# Patient Record
Sex: Female | Born: 1980
Health system: Southern US, Community
[De-identification: ages and names within clinical notes are randomized; demographics above are authoritative.]

## PROBLEM LIST (undated history)

## (undated) DIAGNOSIS — Z973 Presence of spectacles and contact lenses: Secondary | ICD-10-CM

## (undated) DIAGNOSIS — J45909 Unspecified asthma, uncomplicated: Secondary | ICD-10-CM

## (undated) DIAGNOSIS — E282 Polycystic ovarian syndrome: Secondary | ICD-10-CM

## (undated) DIAGNOSIS — K8020 Calculus of gallbladder without cholecystitis without obstruction: Secondary | ICD-10-CM

## (undated) DIAGNOSIS — N7011 Chronic salpingitis: Secondary | ICD-10-CM

## (undated) DIAGNOSIS — Z8489 Family history of other specified conditions: Secondary | ICD-10-CM

## (undated) DIAGNOSIS — D649 Anemia, unspecified: Secondary | ICD-10-CM

## (undated) DIAGNOSIS — K219 Gastro-esophageal reflux disease without esophagitis: Secondary | ICD-10-CM

## (undated) HISTORY — PX: OVARIAN CYST REMOVAL: SHX89

## (undated) HISTORY — PX: DILATION AND CURETTAGE OF UTERUS: SHX78

---

## 2005-08-09 HISTORY — PX: EXPLORATORY LAPAROTOMY: SUR591

## 2019-01-19 DIAGNOSIS — L709 Acne, unspecified: Secondary | ICD-10-CM | POA: Diagnosis not present

## 2019-01-19 DIAGNOSIS — N946 Dysmenorrhea, unspecified: Secondary | ICD-10-CM | POA: Diagnosis not present

## 2019-01-19 DIAGNOSIS — Z23 Encounter for immunization: Secondary | ICD-10-CM | POA: Diagnosis not present

## 2019-01-19 DIAGNOSIS — N939 Abnormal uterine and vaginal bleeding, unspecified: Secondary | ICD-10-CM | POA: Diagnosis not present

## 2019-01-19 DIAGNOSIS — Z13220 Encounter for screening for lipoid disorders: Secondary | ICD-10-CM | POA: Diagnosis not present

## 2019-02-20 DIAGNOSIS — N939 Abnormal uterine and vaginal bleeding, unspecified: Secondary | ICD-10-CM | POA: Diagnosis not present

## 2019-03-01 DIAGNOSIS — J45909 Unspecified asthma, uncomplicated: Secondary | ICD-10-CM | POA: Diagnosis not present

## 2019-03-01 DIAGNOSIS — R10813 Right lower quadrant abdominal tenderness: Secondary | ICD-10-CM | POA: Diagnosis not present

## 2019-03-01 DIAGNOSIS — N858 Other specified noninflammatory disorders of uterus: Secondary | ICD-10-CM | POA: Diagnosis not present

## 2019-03-01 DIAGNOSIS — R109 Unspecified abdominal pain: Secondary | ICD-10-CM | POA: Diagnosis not present

## 2019-03-01 DIAGNOSIS — K8020 Calculus of gallbladder without cholecystitis without obstruction: Secondary | ICD-10-CM | POA: Diagnosis not present

## 2019-03-02 DIAGNOSIS — Z124 Encounter for screening for malignant neoplasm of cervix: Secondary | ICD-10-CM | POA: Diagnosis not present

## 2019-03-02 DIAGNOSIS — N921 Excessive and frequent menstruation with irregular cycle: Secondary | ICD-10-CM | POA: Diagnosis not present

## 2019-03-02 DIAGNOSIS — R87610 Atypical squamous cells of undetermined significance on cytologic smear of cervix (ASC-US): Secondary | ICD-10-CM | POA: Diagnosis not present

## 2019-03-16 DIAGNOSIS — L709 Acne, unspecified: Secondary | ICD-10-CM | POA: Diagnosis not present

## 2019-03-16 DIAGNOSIS — R748 Abnormal levels of other serum enzymes: Secondary | ICD-10-CM | POA: Diagnosis not present

## 2019-03-16 DIAGNOSIS — Z5181 Encounter for therapeutic drug level monitoring: Secondary | ICD-10-CM | POA: Diagnosis not present

## 2019-03-20 DIAGNOSIS — A549 Gonococcal infection, unspecified: Secondary | ICD-10-CM | POA: Diagnosis not present

## 2019-03-20 DIAGNOSIS — D259 Leiomyoma of uterus, unspecified: Secondary | ICD-10-CM | POA: Diagnosis not present

## 2019-03-20 DIAGNOSIS — Z793 Long term (current) use of hormonal contraceptives: Secondary | ICD-10-CM | POA: Diagnosis not present

## 2019-03-20 DIAGNOSIS — J45909 Unspecified asthma, uncomplicated: Secondary | ICD-10-CM | POA: Diagnosis not present

## 2019-03-20 DIAGNOSIS — R102 Pelvic and perineal pain: Secondary | ICD-10-CM | POA: Diagnosis not present

## 2019-03-20 DIAGNOSIS — D252 Subserosal leiomyoma of uterus: Secondary | ICD-10-CM | POA: Diagnosis not present

## 2019-03-20 DIAGNOSIS — Z79899 Other long term (current) drug therapy: Secondary | ICD-10-CM | POA: Diagnosis not present

## 2019-04-06 DIAGNOSIS — A549 Gonococcal infection, unspecified: Secondary | ICD-10-CM | POA: Diagnosis not present

## 2019-04-06 DIAGNOSIS — Z202 Contact with and (suspected) exposure to infections with a predominantly sexual mode of transmission: Secondary | ICD-10-CM | POA: Diagnosis not present

## 2019-04-06 DIAGNOSIS — Z3169 Encounter for other general counseling and advice on procreation: Secondary | ICD-10-CM | POA: Diagnosis not present

## 2019-04-06 DIAGNOSIS — D252 Subserosal leiomyoma of uterus: Secondary | ICD-10-CM | POA: Diagnosis not present

## 2019-05-02 DIAGNOSIS — R0683 Snoring: Secondary | ICD-10-CM | POA: Diagnosis not present

## 2019-05-02 DIAGNOSIS — J309 Allergic rhinitis, unspecified: Secondary | ICD-10-CM | POA: Diagnosis not present

## 2019-07-24 DIAGNOSIS — N911 Secondary amenorrhea: Secondary | ICD-10-CM | POA: Diagnosis not present

## 2019-08-16 DIAGNOSIS — Z1239 Encounter for other screening for malignant neoplasm of breast: Secondary | ICD-10-CM | POA: Diagnosis not present

## 2019-08-16 DIAGNOSIS — N6321 Unspecified lump in the left breast, upper outer quadrant: Secondary | ICD-10-CM | POA: Diagnosis not present

## 2019-08-16 DIAGNOSIS — N6324 Unspecified lump in the left breast, lower inner quadrant: Secondary | ICD-10-CM | POA: Diagnosis not present

## 2019-08-16 DIAGNOSIS — N6489 Other specified disorders of breast: Secondary | ICD-10-CM | POA: Diagnosis not present

## 2019-08-24 DIAGNOSIS — N939 Abnormal uterine and vaginal bleeding, unspecified: Secondary | ICD-10-CM | POA: Diagnosis not present

## 2019-09-25 DIAGNOSIS — E282 Polycystic ovarian syndrome: Secondary | ICD-10-CM | POA: Diagnosis not present

## 2019-09-25 DIAGNOSIS — R7303 Prediabetes: Secondary | ICD-10-CM | POA: Diagnosis not present

## 2019-09-25 DIAGNOSIS — R0683 Snoring: Secondary | ICD-10-CM | POA: Diagnosis not present

## 2019-09-25 DIAGNOSIS — L709 Acne, unspecified: Secondary | ICD-10-CM | POA: Diagnosis not present

## 2019-09-28 DIAGNOSIS — N6324 Unspecified lump in the left breast, lower inner quadrant: Secondary | ICD-10-CM | POA: Diagnosis not present

## 2019-12-12 DIAGNOSIS — R102 Pelvic and perineal pain: Secondary | ICD-10-CM | POA: Diagnosis not present

## 2019-12-12 DIAGNOSIS — B9689 Other specified bacterial agents as the cause of diseases classified elsewhere: Secondary | ICD-10-CM | POA: Diagnosis not present

## 2019-12-12 DIAGNOSIS — N760 Acute vaginitis: Secondary | ICD-10-CM | POA: Diagnosis not present

## 2020-01-05 ENCOUNTER — Emergency Department (HOSPITAL_COMMUNITY)
Admission: EM | Admit: 2020-01-05 | Discharge: 2020-01-05 | Discharge status: Against Medical Advice | Payer: 59 | Attending: Emergency Medicine | Admitting: Emergency Medicine

## 2020-01-05 ENCOUNTER — Other Ambulatory Visit: Payer: Self-pay

## 2020-01-05 ENCOUNTER — Encounter (HOSPITAL_COMMUNITY): Payer: Self-pay | Admitting: Emergency Medicine

## 2020-01-05 DIAGNOSIS — R1030 Lower abdominal pain, unspecified: Secondary | ICD-10-CM | POA: Diagnosis not present

## 2020-01-05 DIAGNOSIS — R109 Unspecified abdominal pain: Secondary | ICD-10-CM | POA: Diagnosis not present

## 2020-01-05 DIAGNOSIS — N898 Other specified noninflammatory disorders of vagina: Secondary | ICD-10-CM | POA: Diagnosis not present

## 2020-01-05 DIAGNOSIS — Z5321 Procedure and treatment not carried out due to patient leaving prior to being seen by health care provider: Secondary | ICD-10-CM | POA: Insufficient documentation

## 2020-01-05 DIAGNOSIS — Z7984 Long term (current) use of oral hypoglycemic drugs: Secondary | ICD-10-CM | POA: Diagnosis not present

## 2020-01-05 DIAGNOSIS — E282 Polycystic ovarian syndrome: Secondary | ICD-10-CM | POA: Diagnosis not present

## 2020-01-05 DIAGNOSIS — Z87891 Personal history of nicotine dependence: Secondary | ICD-10-CM | POA: Diagnosis not present

## 2020-01-05 HISTORY — DX: Polycystic ovarian syndrome: E28.2

## 2020-01-05 LAB — COMPREHENSIVE METABOLIC PANEL
ALT: 38 U/L (ref 0–44)
AST: 26 U/L (ref 15–41)
Albumin: 3.7 g/dL (ref 3.5–5.0)
Alkaline Phosphatase: 102 U/L (ref 38–126)
Anion gap: 11 (ref 5–15)
BUN: 13 mg/dL (ref 6–20)
CO2: 25 mmol/L (ref 22–32)
Calcium: 9.3 mg/dL (ref 8.9–10.3)
Chloride: 105 mmol/L (ref 98–111)
Creatinine, Ser: 0.7 mg/dL (ref 0.44–1.00)
GFR calc Af Amer: >60 mL/min (ref >60)
GFR calc non Af Amer: >60 mL/min (ref >60)
Glucose, Bld: 102 mg/dL — ABNORMAL HIGH (ref 70–99)
Potassium: 3.5 mmol/L (ref 3.5–5.1)
Sodium: 141 mmol/L (ref 135–145)
Total Bilirubin: 0.4 mg/dL (ref 0.3–1.2)
Total Protein: 7.5 g/dL (ref 6.5–8.1)

## 2020-01-05 LAB — CBC
HCT: 41.8 % (ref 36.0–46.0)
Hemoglobin: 13.1 g/dL (ref 12.0–15.0)
MCH: 26.3 pg (ref 26.0–34.0)
MCHC: 31.3 g/dL (ref 30.0–36.0)
MCV: 83.8 fL (ref 80.0–100.0)
Platelets: 279 10*3/uL (ref 150–400)
RBC: 4.99 MIL/uL (ref 3.87–5.11)
RDW: 13.7 % (ref 11.5–15.5)
WBC: 5.5 10*3/uL (ref 4.0–10.5)
nRBC: 0 % (ref 0.0–0.2)

## 2020-01-05 LAB — I-STAT BETA HCG BLOOD, ED (MC, WL, AP ONLY): I-stat hCG, quantitative: <5 m[IU]/mL (ref <5)

## 2020-01-05 LAB — LIPASE, BLOOD: Lipase: 35 U/L (ref 11–51)

## 2020-01-05 MED ORDER — SODIUM CHLORIDE 0.9% FLUSH: 3.0000 mL | Freq: Once | INTRAVENOUS | Status: DC

## 2020-01-05 NOTE — ED Triage Notes (Signed)
C/o pain that is around waist (abd and back) x 1 week.  Pain is worse when she bends over.  Also reports vaginal irritation.  Denies nausea and vomiting.  States she normally has diarrhea due to metformin- no change in stools.  States she had normal wet prep at GYN on 5/5.

## 2020-01-05 NOTE — ED Notes (Signed)
Called pt x5 for room, check outside no response.

## 2020-01-05 NOTE — ED Notes (Signed)
Called pt x2 for room, no response. 

## 2020-04-18 DIAGNOSIS — N926 Irregular menstruation, unspecified: Secondary | ICD-10-CM | POA: Diagnosis not present

## 2020-04-18 DIAGNOSIS — E282 Polycystic ovarian syndrome: Secondary | ICD-10-CM | POA: Diagnosis not present

## 2020-04-18 DIAGNOSIS — Z6841 Body Mass Index (BMI) 40.0 and over, adult: Secondary | ICD-10-CM | POA: Diagnosis not present

## 2020-04-18 DIAGNOSIS — E65 Localized adiposity: Secondary | ICD-10-CM | POA: Diagnosis not present

## 2020-04-18 DIAGNOSIS — R7303 Prediabetes: Secondary | ICD-10-CM | POA: Diagnosis not present

## 2020-04-18 DIAGNOSIS — E669 Obesity, unspecified: Secondary | ICD-10-CM | POA: Diagnosis not present

## 2020-08-03 DIAGNOSIS — S0990XA Unspecified injury of head, initial encounter: Secondary | ICD-10-CM | POA: Diagnosis not present

## 2020-08-03 DIAGNOSIS — S0512XA Contusion of eyeball and orbital tissues, left eye, initial encounter: Secondary | ICD-10-CM | POA: Diagnosis not present

## 2020-08-03 DIAGNOSIS — S0993XA Unspecified injury of face, initial encounter: Secondary | ICD-10-CM | POA: Diagnosis not present

## 2020-08-03 DIAGNOSIS — G8911 Acute pain due to trauma: Secondary | ICD-10-CM | POA: Diagnosis not present

## 2020-08-11 DIAGNOSIS — S0012XA Contusion of left eyelid and periocular area, initial encounter: Secondary | ICD-10-CM | POA: Diagnosis not present

## 2020-08-11 DIAGNOSIS — H1132 Conjunctival hemorrhage, left eye: Secondary | ICD-10-CM | POA: Diagnosis not present

## 2020-09-23 DIAGNOSIS — S0012XA Contusion of left eyelid and periocular area, initial encounter: Secondary | ICD-10-CM | POA: Diagnosis not present

## 2020-09-23 DIAGNOSIS — S0012XD Contusion of left eyelid and periocular area, subsequent encounter: Secondary | ICD-10-CM | POA: Diagnosis not present

## 2020-10-06 DIAGNOSIS — J069 Acute upper respiratory infection, unspecified: Secondary | ICD-10-CM | POA: Diagnosis not present

## 2020-10-07 DIAGNOSIS — Z20822 Contact with and (suspected) exposure to covid-19: Secondary | ICD-10-CM | POA: Diagnosis not present

## 2020-10-17 DIAGNOSIS — N3000 Acute cystitis without hematuria: Secondary | ICD-10-CM | POA: Diagnosis not present

## 2020-10-17 DIAGNOSIS — E282 Polycystic ovarian syndrome: Secondary | ICD-10-CM | POA: Diagnosis not present

## 2020-10-21 ENCOUNTER — Encounter (HOSPITAL_BASED_OUTPATIENT_CLINIC_OR_DEPARTMENT_OTHER): Payer: Self-pay | Admitting: Emergency Medicine

## 2020-10-21 ENCOUNTER — Emergency Department (HOSPITAL_BASED_OUTPATIENT_CLINIC_OR_DEPARTMENT_OTHER)
Admission: EM | Admit: 2020-10-21 | Discharge: 2020-10-21 | Disposition: A | Discharge status: Home / Self Care | Payer: BC Managed Care – PPO | Attending: Emergency Medicine | Admitting: Emergency Medicine

## 2020-10-21 ENCOUNTER — Other Ambulatory Visit: Payer: Self-pay

## 2020-10-21 ENCOUNTER — Emergency Department (HOSPITAL_BASED_OUTPATIENT_CLINIC_OR_DEPARTMENT_OTHER): Payer: BC Managed Care – PPO

## 2020-10-21 ENCOUNTER — Other Ambulatory Visit: Payer: Self-pay | Admitting: Family Medicine

## 2020-10-21 DIAGNOSIS — N898 Other specified noninflammatory disorders of vagina: Secondary | ICD-10-CM | POA: Insufficient documentation

## 2020-10-21 DIAGNOSIS — R102 Pelvic and perineal pain: Secondary | ICD-10-CM | POA: Diagnosis not present

## 2020-10-21 DIAGNOSIS — D259 Leiomyoma of uterus, unspecified: Secondary | ICD-10-CM

## 2020-10-21 DIAGNOSIS — N760 Acute vaginitis: Secondary | ICD-10-CM

## 2020-10-21 DIAGNOSIS — R109 Unspecified abdominal pain: Secondary | ICD-10-CM

## 2020-10-21 DIAGNOSIS — R112 Nausea with vomiting, unspecified: Secondary | ICD-10-CM | POA: Diagnosis not present

## 2020-10-21 DIAGNOSIS — B9689 Other specified bacterial agents as the cause of diseases classified elsewhere: Secondary | ICD-10-CM

## 2020-10-21 DIAGNOSIS — K8020 Calculus of gallbladder without cholecystitis without obstruction: Secondary | ICD-10-CM | POA: Diagnosis not present

## 2020-10-21 DIAGNOSIS — R630 Anorexia: Secondary | ICD-10-CM | POA: Insufficient documentation

## 2020-10-21 DIAGNOSIS — R1032 Left lower quadrant pain: Secondary | ICD-10-CM | POA: Diagnosis not present

## 2020-10-21 DIAGNOSIS — E282 Polycystic ovarian syndrome: Secondary | ICD-10-CM

## 2020-10-21 LAB — COMPREHENSIVE METABOLIC PANEL
ALT: 55 U/L — ABNORMAL HIGH (ref 0–44)
AST: 36 U/L (ref 15–41)
Albumin: 4.1 g/dL (ref 3.5–5.0)
Alkaline Phosphatase: 134 U/L — ABNORMAL HIGH (ref 38–126)
Anion gap: 10 (ref 5–15)
BUN: 11 mg/dL (ref 6–20)
CO2: 26 mmol/L (ref 22–32)
Calcium: 9.1 mg/dL (ref 8.9–10.3)
Chloride: 103 mmol/L (ref 98–111)
Creatinine, Ser: 0.8 mg/dL (ref 0.44–1.00)
GFR, Estimated: >60 mL/min (ref >60)
Glucose, Bld: 138 mg/dL — ABNORMAL HIGH (ref 70–99)
Potassium: 3.5 mmol/L (ref 3.5–5.1)
Sodium: 139 mmol/L (ref 135–145)
Total Bilirubin: 0.3 mg/dL (ref 0.3–1.2)
Total Protein: 7.2 g/dL (ref 6.5–8.1)

## 2020-10-21 LAB — URINALYSIS, ROUTINE W REFLEX MICROSCOPIC
Bilirubin Urine: NEGATIVE
Glucose, UA: NEGATIVE mg/dL
Hgb urine dipstick: NEGATIVE
Ketones, ur: NEGATIVE mg/dL
Nitrite: NEGATIVE
Specific Gravity, Urine: 1.031 — ABNORMAL HIGH (ref 1.005–1.030)
pH: 5.5 (ref 5.0–8.0)

## 2020-10-21 LAB — CBC WITH DIFFERENTIAL/PLATELET
Abs Immature Granulocytes: 0.02 10*3/uL (ref 0.00–0.07)
Basophils Absolute: 0 10*3/uL (ref 0.0–0.1)
Basophils Relative: 1 %
Eosinophils Absolute: 0.2 10*3/uL (ref 0.0–0.5)
Eosinophils Relative: 3 %
HCT: 40.2 % (ref 36.0–46.0)
Hemoglobin: 12.6 g/dL (ref 12.0–15.0)
Immature Granulocytes: 0 %
Lymphocytes Relative: 41 %
Lymphs Abs: 2.7 10*3/uL (ref 0.7–4.0)
MCH: 26.8 pg (ref 26.0–34.0)
MCHC: 31.3 g/dL (ref 30.0–36.0)
MCV: 85.4 fL (ref 80.0–100.0)
Monocytes Absolute: 0.3 10*3/uL (ref 0.1–1.0)
Monocytes Relative: 4 %
Neutro Abs: 3.4 10*3/uL (ref 1.7–7.7)
Neutrophils Relative %: 51 %
Platelets: 269 10*3/uL (ref 150–400)
RBC: 4.71 MIL/uL (ref 3.87–5.11)
RDW: 13.6 % (ref 11.5–15.5)
WBC: 6.6 10*3/uL (ref 4.0–10.5)
nRBC: 0 % (ref 0.0–0.2)

## 2020-10-21 LAB — WET PREP, GENITAL
Sperm: NONE SEEN
Trich, Wet Prep: NONE SEEN
Yeast Wet Prep HPF POC: NONE SEEN

## 2020-10-21 LAB — PREGNANCY, URINE: Preg Test, Ur: NEGATIVE

## 2020-10-21 MED ORDER — KETOROLAC TROMETHAMINE 15 MG/ML IJ SOLN
15.0000 mg | Freq: Once | INTRAMUSCULAR | Status: AC
  Administered 2020-10-21: 15 mg via INTRAVENOUS
  Filled 2020-10-21: qty 1

## 2020-10-21 MED ORDER — LACTATED RINGERS IV BOLUS
1000.0000 mL | Freq: Once | INTRAVENOUS | Status: AC
  Administered 2020-10-21: 1000 mL via INTRAVENOUS

## 2020-10-21 MED ORDER — ONDANSETRON HCL 4 MG/2ML IJ SOLN
4.0000 mg | Freq: Once | INTRAMUSCULAR | Status: AC
  Administered 2020-10-21: 4 mg via INTRAVENOUS
  Filled 2020-10-21: qty 2

## 2020-10-21 MED ORDER — METRONIDAZOLE 500 MG PO TABS: 500.0000 mg | ORAL_TABLET | Freq: Two times a day (BID) | ORAL | 0 refills | Status: DC

## 2020-10-21 MED ORDER — IOHEXOL 300 MG/ML  SOLN
100.0000 mL | Freq: Once | INTRAMUSCULAR | Status: AC | PRN
  Administered 2020-10-21: 100 mL via INTRAVENOUS

## 2020-10-21 NOTE — ED Triage Notes (Signed)
Pt via pov from home with LLQ abdominal pain x 6 days. Pt states she has PCOS. She began having vomiting on Wednesday, was in severe pain until yesterday, has eased off some today. Pt states she has been in communication with her physician, and had an appointment with her today. The physican sent her here for imaging, since they could not get an outpatient appointment today. Pt reports vomiting x 2 today, denies diarrhea, still having some nausea. NAD noted.

## 2020-10-21 NOTE — ED Notes (Signed)
Pt to US.

## 2020-10-21 NOTE — Discharge Instructions (Addendum)
It was our pleasure to provide your ER care today - we hope that you feel better.  Take acetaminophen or ibuprofen as need for symptom relief.   On your imaging, no definite cause of your left-sided pain was noted.  Incidental note was made of small uterine fibroids and a gallstone in your gallbladder - see attached information.   Your lab tests show some 'clue cells' - take antibiotic as prescribed.   If recurrent pain related to gallstones - follow up with general surgeon - see attached info, call to arrange appointment.   Return to ER if worse, new symptoms, fevers, severe pain, persistent vomiting, or other concern.

## 2020-10-21 NOTE — ED Provider Notes (Signed)
[facility] EMERGENC[facility] DEPT Provider Note   CSN: [id number] Arrival date & time: 10/21/20  1145     History Chief Complaint  Patient presents with  . Abdominal Pain    Amy Henry is a 40 y.o. female.  The history is provided by the patient.  Abdominal Pain Pain location:  LLQ Pain quality: cramping, gnawing, sharp and shooting   Pain radiation: slightly down the front of the left leg and to the left side but not back to the flank. Pain severity now: currently 4/10 but at it's max 8/10. Onset quality:  Gradual Duration:  1 week Timing:  Constant Progression:  Waxing and waning Chronicity:  Recurrent Context: previous surgery   Context: not recent illness, not recent travel and not sick contacts   Context comment:  Hx of ovarian cyst with surgery in the past but feels similar Relieved by:  Nothing Worsened by:  Palpation Ineffective treatments:  None tried Associated symptoms: anorexia, nausea and vomiting   Associated symptoms: no diarrhea, no dysuria, no fever, no hematuria, no vaginal bleeding and no vaginal discharge   Risk factors comment:  Pcos      Past Medical History:  Diagnosis Date  . PCOS (polycystic ovarian syndrome)     There are no problems to display for this patient.   Past Surgical History:  Procedure Laterality Date  . OVARIAN CYST REMOVAL       OB History   No obstetric history on file.     History reviewed. No pertinent family history.  Social History   Tobacco Use  . Smoking status: Never Smoker  . Smokeless tobacco: Never Used  Vaping Use  . Vaping Use: Never used  Substance Use Topics  . Alcohol use: Yes    Comment: "a couple of times per week"  . Drug use: Not Currently    Home Medications Prior to Admission medications   Not on File    Allergies    Patient has no known allergies.  Review of Systems   Review of Systems  Constitutional: Negative for fever.  Gastrointestinal: Positive for  abdominal pain, anorexia, nausea and vomiting. Negative for diarrhea.  Genitourinary: Negative for dysuria, hematuria, vaginal bleeding and vaginal discharge.  All other systems reviewed and are negative.   Physical Exam Updated Vital Signs BP (!) 146/83 (BP Location: Right Arm)   Pulse 76   Temp 97.9 F (36.6 C) (Oral)   Resp 13   Ht 5' 4.5" (1.638 m)   Wt 115.7 kg   SpO2 100%   BMI 43.09 kg/m   Physical Exam Vitals and nursing note reviewed.  Constitutional:      General: She is not in acute distress.    Appearance: Normal appearance. She is well-developed.  HENT:     Head: Normocephalic and atraumatic.     Mouth/Throat:     Mouth: Mucous membranes are moist.  Eyes:     Pupils: Pupils are equal, round, and reactive to light.  Cardiovascular:     Rate and Rhythm: Normal rate and regular rhythm.     Pulses: Normal pulses.     Heart sounds: Normal heart sounds. No murmur heard. No friction rub.  Pulmonary:     Effort: Pulmonary effort is normal.     Breath sounds: Normal breath sounds. No wheezing or rales.  Abdominal:     General: Bowel sounds are normal. There is no distension.     Palpations: Abdomen is soft.     Tenderness: There  is abdominal tenderness in the left lower quadrant. There is guarding. There is no left CVA tenderness or rebound.  Genitourinary:    Vagina: Vaginal discharge present.     Cervix: Normal.     Uterus: Normal.      Adnexa: Right adnexa normal.       Left: Tenderness present. No mass or fullness.       Comments: Thin white d/c present Musculoskeletal:        General: No tenderness. Normal range of motion.     Cervical back: Normal range of motion.     Comments: No edema  Skin:    General: Skin is warm and dry.     Findings: No rash.  Neurological:     General: No focal deficit present.     Mental Status: She is alert and oriented to person, place, and time. Mental status is at baseline.     Cranial Nerves: No cranial nerve deficit.   Psychiatric:        Mood and Affect: Mood normal.        Behavior: Behavior normal.        Thought Content: Thought content normal.     ED Results / Procedures / Treatments   Labs (all labs ordered are listed, but only abnormal results are displayed) Labs Reviewed  WET PREP, GENITAL - Abnormal; Notable for the following components:      Result Value   Clue Cells Wet Prep HPF POC PRESENT (*)    WBC, Wet Prep HPF POC MODERATE (*)    All other components within normal limits  COMPREHENSIVE METABOLIC PANEL - Abnormal; Notable for the following components:   Glucose, Bld 138 (*)    ALT 55 (*)    Alkaline Phosphatase 134 (*)    All other components within normal limits  URINALYSIS, ROUTINE W REFLEX MICROSCOPIC - Abnormal; Notable for the following components:   Specific Gravity, Urine 1.031 (*)    Protein, ur TRACE (*)    Leukocytes,Ua TRACE (*)    All other components within normal limits  CBC WITH DIFFERENTIAL/PLATELET  PREGNANCY, URINE  GC/CHLAMYDIA PROBE AMP ([place]) NOT AT [facility]    EKG None  Radiology US PELVIC COMPLETE W TRANSVAGINAL AND TORSION R/O  Result Date: 10/21/2020 CLINICAL DATA:  Post left lower quadrant pain EXAM: TRANSABDOMINAL AND TRANSVAGINAL ULTRASOUND OF PELVIS DOPPLER ULTRASOUND OF OVARIES TECHNIQUE: Both transabdominal and transvaginal ultrasound examinations of the pelvis were performed. Transabdominal technique was performed for global imaging of the pelvis including uterus, ovaries, adnexal regions, and pelvic cul-de-sac. It was necessary to proceed with endovaginal exam following the transabdominal exam to visualize the left ovary and adnexa. Color and duplex Doppler ultrasound was utilized to evaluate blood flow to the ovaries. COMPARISON:  None. FINDINGS: Uterus Measurements: 8.6 x 5.8 x 5.8 cm = volume: 151.3 mL. 1.7 x 1.8 x 1 cm subserosal fibroid at the fundus. 1.6 x 2 x 1.1 cm intramural fibroid at the ventral body. Endometrium Thickness: 8 mm.   No focal abnormality visualized. Right ovary Measurements: 2.3 x 1 x 1.5 cm = volume: 3.3 mL. Normal appearance/no adnexal mass. Left ovary Measurements: 2.2 x 2.4 x 2.2 cm = volume: 3.9 mL. Normal appearance/no adnexal mass. Pulsed Doppler evaluation of both ovaries demonstrates normal low-resistance arterial and venous waveforms. Other findings No abnormal free fluid. IMPRESSION: No evidence of ovarian torsion. Two small uterine fibroids. Electronically Signed   By: Macy Mis M.D.   On: 10/21/2020 14:02  Procedures Procedures   Medications Ordered in ED Medications  ondansetron (ZOFRAN) injection 4 mg (has no administration in time range)  lactated ringers bolus 1,000 mL (has no administration in time range)    ED Course  I have reviewed the triage vital signs and the nursing notes.  Pertinent labs & imaging results that were available during my care of the patient were reviewed by me and considered in my medical decision making (see chart for details).    MDM Rules/Calculators/A&P                          40 year old female with a history of PCOS and prior ovarian cyst presenting today with a 1 week history of left lower quadrant pain.  She saw her doctor today who sent her here because she felt that she needed imaging.  Patient reports that the pain is slightly improved today but she has been having intermittent vomiting, decreased oral intake and left lower quadrant pain.  No diarrhea or fever.  On exam patient has very localized pain in the left lower quadrant concerning for most likely ovarian pathology but could also be diverticulitis.  Patient reports she took a home pregnancy and urine test last week which was negative for UTI or pregnancy.  She does use contraception and is currently sexually active with one partner for the last 1 year but reports they both get tested on a fairly regular basis and have been negative for STIs.  She denies any vaginal discharge or itching or  burning.  On exam vital signs are within normal limits.  We will do pelvic exam for further evaluation.  Labs and urine are pending.  Patient will most likely need transvaginal ultrasound.  She currently just wanted medication for nausea but reports the pain is tolerable at a 4 out of 10.  12:33 PM  Left pelvic pain present.  U/S to further evaluate is pending.  2:19 PM Wet prep with clue cells and moderate white blood cells but no evidence of trichomonas or yeast.  Ultrasound without acute findings or cause for her pain.  On reevaluation patient is still having significant left lower quadrant and pelvic pain.  We will do a CT to rule out diverticulitis.  Final Clinical Impression(s) / ED Diagnoses Final diagnoses:  None    Rx / DC Orders ED Discharge Orders    None       Blanchie Dessert, MD 10/21/20 1420

## 2020-10-21 NOTE — ED Provider Notes (Addendum)
Signed out to d/c to home when ct back (and that will need rx clue cells present on wet prep, but clinically no PID).  CT neg for acute process.   Discussed incidental note of small fibroids, and gallstone.   Recheck abd soft non tender.   Pt appears stable for d/c.       Lajean Saver, MD 10/21/20 1620

## 2020-10-21 NOTE — ED Notes (Signed)
Pt partner brought back to room  Pt has call bell and TV remote - lights turned down  ED process explained

## 2020-10-21 NOTE — ED Notes (Signed)
Patient returned from CT

## 2020-10-21 NOTE — ED Notes (Signed)
Pt returned from ultrasound

## 2020-10-22 LAB — GC/CHLAMYDIA PROBE AMP (~~LOC~~) NOT AT ARMC
Chlamydia: NEGATIVE
Comment: NEGATIVE
Comment: NORMAL
Neisseria Gonorrhea: NEGATIVE

## 2020-10-28 ENCOUNTER — Other Ambulatory Visit: Payer: 59

## 2020-10-29 DIAGNOSIS — Z309 Encounter for contraceptive management, unspecified: Secondary | ICD-10-CM | POA: Diagnosis not present

## 2020-10-29 DIAGNOSIS — N946 Dysmenorrhea, unspecified: Secondary | ICD-10-CM | POA: Diagnosis not present

## 2020-10-29 DIAGNOSIS — B9689 Other specified bacterial agents as the cause of diseases classified elsewhere: Secondary | ICD-10-CM | POA: Diagnosis not present

## 2020-10-29 DIAGNOSIS — N760 Acute vaginitis: Secondary | ICD-10-CM | POA: Diagnosis not present

## 2021-02-24 DIAGNOSIS — Z8639 Personal history of other endocrine, nutritional and metabolic disease: Secondary | ICD-10-CM | POA: Diagnosis not present

## 2021-02-24 DIAGNOSIS — E282 Polycystic ovarian syndrome: Secondary | ICD-10-CM | POA: Diagnosis not present

## 2021-02-24 DIAGNOSIS — I10 Essential (primary) hypertension: Secondary | ICD-10-CM | POA: Diagnosis not present

## 2021-03-06 DIAGNOSIS — I10 Essential (primary) hypertension: Secondary | ICD-10-CM | POA: Diagnosis not present

## 2021-03-06 DIAGNOSIS — Z8639 Personal history of other endocrine, nutritional and metabolic disease: Secondary | ICD-10-CM | POA: Diagnosis not present

## 2021-03-06 DIAGNOSIS — E282 Polycystic ovarian syndrome: Secondary | ICD-10-CM | POA: Diagnosis not present

## 2021-03-12 DIAGNOSIS — E785 Hyperlipidemia, unspecified: Secondary | ICD-10-CM | POA: Diagnosis not present

## 2021-03-12 DIAGNOSIS — R748 Abnormal levels of other serum enzymes: Secondary | ICD-10-CM | POA: Diagnosis not present

## 2021-03-12 DIAGNOSIS — I10 Essential (primary) hypertension: Secondary | ICD-10-CM | POA: Diagnosis not present

## 2021-03-12 DIAGNOSIS — R7989 Other specified abnormal findings of blood chemistry: Secondary | ICD-10-CM | POA: Diagnosis not present

## 2021-04-23 DIAGNOSIS — R7303 Prediabetes: Secondary | ICD-10-CM | POA: Diagnosis not present

## 2021-04-23 DIAGNOSIS — K8020 Calculus of gallbladder without cholecystitis without obstruction: Secondary | ICD-10-CM | POA: Diagnosis not present

## 2021-04-23 DIAGNOSIS — I10 Essential (primary) hypertension: Secondary | ICD-10-CM | POA: Diagnosis not present

## 2021-05-05 DIAGNOSIS — K828 Other specified diseases of gallbladder: Secondary | ICD-10-CM | POA: Diagnosis not present

## 2021-05-05 DIAGNOSIS — K8020 Calculus of gallbladder without cholecystitis without obstruction: Secondary | ICD-10-CM | POA: Diagnosis not present

## 2021-06-02 DIAGNOSIS — J019 Acute sinusitis, unspecified: Secondary | ICD-10-CM | POA: Diagnosis not present

## 2021-06-03 DIAGNOSIS — Z20822 Contact with and (suspected) exposure to covid-19: Secondary | ICD-10-CM | POA: Diagnosis not present

## 2021-07-01 ENCOUNTER — Emergency Department (HOSPITAL_BASED_OUTPATIENT_CLINIC_OR_DEPARTMENT_OTHER): Payer: Medicaid Other

## 2021-07-01 ENCOUNTER — Emergency Department (HOSPITAL_BASED_OUTPATIENT_CLINIC_OR_DEPARTMENT_OTHER): Payer: Medicaid Other | Admitting: Radiology

## 2021-07-01 ENCOUNTER — Encounter (HOSPITAL_BASED_OUTPATIENT_CLINIC_OR_DEPARTMENT_OTHER): Payer: Self-pay | Admitting: Emergency Medicine

## 2021-07-01 ENCOUNTER — Other Ambulatory Visit: Payer: Self-pay

## 2021-07-01 ENCOUNTER — Emergency Department (HOSPITAL_BASED_OUTPATIENT_CLINIC_OR_DEPARTMENT_OTHER)
Admission: EM | Admit: 2021-07-01 | Discharge: 2021-07-01 | Disposition: A | Discharge status: Home / Self Care | Payer: Medicaid Other | Attending: Emergency Medicine | Admitting: Emergency Medicine

## 2021-07-01 DIAGNOSIS — R7401 Elevation of levels of liver transaminase levels: Secondary | ICD-10-CM

## 2021-07-01 DIAGNOSIS — Z7982 Long term (current) use of aspirin: Secondary | ICD-10-CM | POA: Diagnosis not present

## 2021-07-01 DIAGNOSIS — R1013 Epigastric pain: Secondary | ICD-10-CM

## 2021-07-01 DIAGNOSIS — K920 Hematemesis: Secondary | ICD-10-CM | POA: Diagnosis not present

## 2021-07-01 DIAGNOSIS — K7689 Other specified diseases of liver: Secondary | ICD-10-CM | POA: Diagnosis not present

## 2021-07-01 DIAGNOSIS — K8020 Calculus of gallbladder without cholecystitis without obstruction: Secondary | ICD-10-CM | POA: Diagnosis not present

## 2021-07-01 LAB — CBC WITH DIFFERENTIAL/PLATELET
Abs Immature Granulocytes: 0.03 10*3/uL (ref 0.00–0.07)
Basophils Absolute: 0.1 10*3/uL (ref 0.0–0.1)
Basophils Relative: 1 %
Eosinophils Absolute: 0.2 10*3/uL (ref 0.0–0.5)
Eosinophils Relative: 3 %
HCT: 39.3 % (ref 36.0–46.0)
Hemoglobin: 12.5 g/dL (ref 12.0–15.0)
Immature Granulocytes: 1 %
Lymphocytes Relative: 33 %
Lymphs Abs: 1.8 10*3/uL (ref 0.7–4.0)
MCH: 27.2 pg (ref 26.0–34.0)
MCHC: 31.8 g/dL (ref 30.0–36.0)
MCV: 85.4 fL (ref 80.0–100.0)
Monocytes Absolute: 0.3 10*3/uL (ref 0.1–1.0)
Monocytes Relative: 6 %
Neutro Abs: 3.1 10*3/uL (ref 1.7–7.7)
Neutrophils Relative %: 56 %
Platelets: 294 10*3/uL (ref 150–400)
RBC: 4.6 MIL/uL (ref 3.87–5.11)
RDW: 13.4 % (ref 11.5–15.5)
WBC: 5.4 10*3/uL (ref 4.0–10.5)
nRBC: 0 % (ref 0.0–0.2)

## 2021-07-01 LAB — COMPREHENSIVE METABOLIC PANEL
ALT: 123 U/L — ABNORMAL HIGH (ref 0–44)
AST: 156 U/L — ABNORMAL HIGH (ref 15–41)
Albumin: 4.3 g/dL (ref 3.5–5.0)
Alkaline Phosphatase: 186 U/L — ABNORMAL HIGH (ref 38–126)
Anion gap: 8 (ref 5–15)
BUN: 13 mg/dL (ref 6–20)
CO2: 28 mmol/L (ref 22–32)
Calcium: 10.1 mg/dL (ref 8.9–10.3)
Chloride: 100 mmol/L (ref 98–111)
Creatinine, Ser: 0.8 mg/dL (ref 0.44–1.00)
GFR, Estimated: >60 mL/min (ref >60)
Glucose, Bld: 110 mg/dL — ABNORMAL HIGH (ref 70–99)
Potassium: 4.2 mmol/L (ref 3.5–5.1)
Sodium: 136 mmol/L (ref 135–145)
Total Bilirubin: 1 mg/dL (ref 0.3–1.2)
Total Protein: 7.7 g/dL (ref 6.5–8.1)

## 2021-07-01 LAB — LIPASE, BLOOD: Lipase: 21 U/L (ref 11–51)

## 2021-07-01 MED ORDER — OMEPRAZOLE 20 MG PO CPDR: 20.0000 mg | DELAYED_RELEASE_CAPSULE | Freq: Every day | ORAL | 0 refills | Status: DC

## 2021-07-01 MED ORDER — SUCRALFATE 1 G PO TABS: 1.0000 g | ORAL_TABLET | Freq: Three times a day (TID) | ORAL | 0 refills | Status: DC

## 2021-07-01 MED ORDER — LIDOCAINE VISCOUS HCL 2 % MT SOLN
15.0000 mL | Freq: Once | OROMUCOSAL | Status: AC
  Administered 2021-07-01: 15 mL via ORAL
  Filled 2021-07-01: qty 15

## 2021-07-01 MED ORDER — ONDANSETRON 4 MG PO TBDP
4.0000 mg | ORAL_TABLET | Freq: Once | ORAL | Status: AC
  Administered 2021-07-01: 4 mg via ORAL
  Filled 2021-07-01: qty 1

## 2021-07-01 MED ORDER — FAMOTIDINE 20 MG PO TABS: 20.0000 mg | ORAL_TABLET | Freq: Two times a day (BID) | ORAL | 0 refills | Status: AC

## 2021-07-01 MED ORDER — ALUM & MAG HYDROXIDE-SIMETH 200-200-20 MG/5ML PO SUSP
30.0000 mL | Freq: Once | ORAL | Status: AC
  Administered 2021-07-01: 30 mL via ORAL
  Filled 2021-07-01: qty 30

## 2021-07-01 NOTE — ED Triage Notes (Signed)
States abd pain about 11 and she vomited  x3  , it had some blood in it last time

## 2021-07-01 NOTE — ED Provider Notes (Signed)
[facility] EMERGENCY DEPT Provider Note   CSN: [id number] Arrival date & time: 07/01/21  1513     History Chief Complaint  Patient presents with   Abdominal Pain    Amy Henry is a 40 y.o. female.  Patient with history of PCOS, no previous abdominal surgeries presents to the emergency department for evaluation of vomiting and upper abdominal pain.  Patient has a known history of gallstones.  She states that around 11 AM she developed pain in her epigastric area.  Around 1 PM she developed vomiting.  She had 3 episodes and the last episode she noticed some bright red blood in the vomit.  No chest pain or shortness of breath.  No fevers.  She had a bowel movement but no diarrhea or blood in the stool.  No history of peptic ulcer disease.  She denies heavy NSAID use but has taken aspirin several times over the past couple of days while at work.  No alcohol use.  She states that pain is gradually improving but not gone.  The onset of this condition was acute. The course is constant. Aggravating factors: none. Alleviating factors: none.        Past Medical History:  Diagnosis Date   PCOS (polycystic ovarian syndrome)     There are no problems to display for this patient.   Past Surgical History:  Procedure Laterality Date   OVARIAN CYST REMOVAL       OB History   No obstetric history on file.     No family history on file.  Social History   Tobacco Use   Smoking status: Never   Smokeless tobacco: Never  Vaping Use   Vaping Use: Never used  Substance Use Topics   Alcohol use: Yes    Comment: "a couple of times per week"   Drug use: Not Currently    Home Medications Prior to Admission medications   Medication Sig Start Date End Date Taking? Authorizing Provider  ipratropium (ATROVENT) 0.03 % nasal spray Place into the nose. 10/06/20   [provider]  ipratropium (ATROVENT) 0.03 % nasal spray Place into both nostrils. 10/06/20   [provider]  metFORMIN (GLUCOPHAGE) 1000 MG tablet Take by mouth.    [provider]  metroNIDAZOLE (FLAGYL) 500 MG tablet Take 1 tablet (500 mg total) by mouth 2 (two) times daily. 10/21/20   Lajean Saver, MD  Norgestimate-Ethinyl Estradiol Triphasic (TRI-LO-MARZIA) 0.18/0.215/0.25 MG-25 MCG tab Take 1 tablet by mouth daily. 10/17/20   [provider]  spironolactone (ALDACTONE) 50 MG tablet Take 1 tablet by mouth daily. 04/18/20 04/18/21  [provider]    Allergies    Patient has no known allergies.  Review of Systems   Review of Systems  Constitutional:  Negative for fever.  HENT:  Negative for rhinorrhea and sore throat.   Eyes:  Negative for redness.  Respiratory:  Negative for cough.   Cardiovascular:  Negative for chest pain.  Gastrointestinal:  Positive for abdominal pain, nausea and vomiting. Negative for blood in stool and diarrhea.  Genitourinary:  Negative for dysuria, frequency, hematuria and urgency.  Musculoskeletal:  Negative for myalgias.  Skin:  Negative for rash.  Neurological:  Negative for headaches.   Physical Exam Updated Vital Signs BP (!) 145/81 (BP Location: Right Wrist)   Pulse 73   Temp 98.3 F (36.8 C)   Resp 18   Ht 5\' 4"  (1.626 m)   Wt 106.6 kg   SpO2 100%  BMI 40.34 kg/m   Physical Exam Vitals and nursing note reviewed.  Constitutional:      General: She is not in acute distress.    Appearance: She is well-developed.  HENT:     Head: Normocephalic and atraumatic.     Right Ear: External ear normal.     Left Ear: External ear normal.     Nose: Nose normal.  Eyes:     Conjunctiva/sclera: Conjunctivae normal.  Cardiovascular:     Rate and Rhythm: Normal rate and regular rhythm.     Heart sounds: No murmur heard. Pulmonary:     Effort: No respiratory distress.     Breath sounds: No wheezing, rhonchi or rales.  Abdominal:     Palpations: Abdomen is soft.     Tenderness: There is abdominal tenderness in  the epigastric area and left upper quadrant. There is no guarding or rebound.     Comments: Mild epigastric and left upper quadrant pain.  No focal pain over the right upper quadrant.  No rebound or guarding.  Musculoskeletal:     Cervical back: Normal range of motion and neck supple.     Right lower leg: No edema.     Left lower leg: No edema.  Skin:    General: Skin is warm and dry.     Findings: No rash.  Neurological:     General: No focal deficit present.     Mental Status: She is alert. Mental status is at baseline.     Motor: No weakness.  Psychiatric:        Mood and Affect: Mood normal.    ED Results / Procedures / Treatments   Labs (all labs ordered are listed, but only abnormal results are displayed) Labs Reviewed  COMPREHENSIVE METABOLIC PANEL - Abnormal; Notable for the following components:      Result Value   Glucose, Bld 110 (*)    AST 156 (*)    ALT 123 (*)    Alkaline Phosphatase 186 (*)    All other components within normal limits  CBC WITH DIFFERENTIAL/PLATELET  LIPASE, BLOOD  PREGNANCY, URINE    EKG None  Radiology DG Chest 2 View  Result Date: 07/01/2021 CLINICAL DATA:  Hematemesis EXAM: CHEST - 2 VIEW COMPARISON:  None. FINDINGS: The heart size and mediastinal contours are within normal limits. Both lungs are clear. The visualized skeletal structures are unremarkable. IMPRESSION: Negative. Electronically Signed   By: Rolm Baptise M.D.   On: 07/01/2021 16:37   US Abdomen Limited RUQ (LIVER/GB)  Result Date: 07/01/2021 CLINICAL DATA:  Epigastric pain transaminitis EXAM: ULTRASOUND ABDOMEN LIMITED RIGHT UPPER QUADRANT COMPARISON:  CT 10/21/2020 FINDINGS: Gallbladder: Shadowing stone within the gallbladder measuring up to 1.2 cm. Normal wall thickness. Negative sonographic Murphy. Common bile duct: Diameter: 4.9 mm Liver: Diffusely echogenic. No focal hepatic abnormality. Portal vein is patent on color Doppler imaging with normal direction of blood flow  towards the liver. Other: None. IMPRESSION: 1. Cholelithiasis without sonographic evidence for acute cholecystitis 2. Echogenic liver consistent with hepatic steatosis Electronically Signed   By: Donavan Foil M.D.   On: 07/01/2021 18:31    Procedures Procedures   Medications Ordered in ED Medications - No data to display  ED Course  I have reviewed the triage vital signs and the nursing notes.  Pertinent labs & imaging results that were available during my care of the patient were reviewed by me and considered in my medical decision making (see chart for details).  Patient seen and examined.   Labs: CBC, CMP, lipase due to gallbladder history.  Urine pregnancy due to vomiting.  Imaging: Chest x-ray due to reported hematemesis.  Medications: ODT Zofran, GI cocktail.  Patient did drive here today.  Vital signs reviewed and are as follows: BP (!) 145/81 (BP Location: Right Wrist)   Pulse 73   Temp 98.3 F (36.8 C)   Resp 18   Ht 5\' 4"  (1.626 m)   Wt 106.6 kg   SpO2 100%   BMI 40.34 kg/m   AST, ALT, alkaline phosphatase were high.  For this reason right upper quadrant ultrasound was ordered.  6:54 PM patient updated on all results.  She states that the GI cocktail given earlier did help.  She has mild pain currently and no rebound or guarding on reexam.  Plan for discharge to home with PPI, H2 blocker, Carafate.  Encouraged patient to reschedule appointment with surgeon for evaluation of her gallbladder.  Encourage PCP follow-up for recheck of symptoms and to have her liver function test rechecked.  Discussed avoidance of excessive Tylenol, NSAID, alcohol use as these can stress the liver and stomach.  Patient states he is comfortable with discharged home.  The patient was urged to return to the Emergency Department immediately with worsening of current symptoms, worsening abdominal pain, persistent vomiting, blood noted in stools, fever, or any other concerns. The patient  verbalized understanding.     MDM Rules/Calculators/A&P                           Patient with abdominal pain. Vitals are stable, no fever. Labs elevated liver function tests. Imaging RUQ ultrasound with gallstones, no cholecystitis. No signs of dehydration, patient is now tolerating PO's. Lungs are clear and no signs suggestive of PNA. Low concern for appendicitis, cholecystitis, pancreatitis, ruptured viscus, UTI, kidney stone, aortic dissection, aortic aneurysm or other emergent abdominal etiology. Supportive therapy indicated with return if symptoms worsen.   Hematemesis: With multiple episodes of vomiting.  Suspect Mallory-Weiss tears.  Normal hemoglobin.  Chest x-ray is clear.  Elevated LFTs: Ultrasound with hepatic steatosis. Will need rechecked by her doctor.  She has had isolated singular LFT elevation in the past.  Values today are worse than previous.  She will need to have these rechecked by her doctor with additional work-up if persistently elevated.  Cholelithiasis: Unclear if this is contributing to symptoms today, however patient's pain is now controlled.  She did get relief with GI cocktail which may indicate more of a gastritis/PUD picture.   Final Clinical Impression(s) / ED Diagnoses Final diagnoses:  Epigastric pain  Transaminitis  Hematemesis with nausea  Calculus of gallbladder without cholecystitis without obstruction    Rx / DC Orders ED Discharge Orders          Ordered    famotidine (PEPCID) 20 MG tablet  2 times daily        07/01/21 1852    sucralfate (CARAFATE) 1 g tablet  3 times daily with meals & bedtime        07/01/21 1852    omeprazole (PRILOSEC) 20 MG capsule  Daily        07/01/21 1852             Carlisle Cater, Hershal Coria 07/01/21 1858    Luna Fuse, MD 07/01/21 2310

## 2021-07-01 NOTE — Discharge Instructions (Signed)
Please read and follow all provided instructions.  Your diagnoses today include:  1. Epigastric pain   2. Transaminitis   3. Hematemesis with nausea   4. Calculus of gallbladder without cholecystitis without obstruction     Tests performed today include: Blood cell counts and platelets: Normal white and red blood cell count Kidney and liver function tests: Your liver function tests were mildly elevated, you will need to have this rechecked by your doctor Pancreas function test (called lipase): Was normal Ultrasound of the gallbladder: She has a gallstone without signs of gallbladder infection Vital signs. See below for your results today.   Medications prescribed:  Pepcid (famotidine) - antihistamine  You can find this medication over-the-counter.   DO NOT exceed:  20mg  Pepcid every 12 hours  Omeprazole (Prilosec) - stomach acid reducer  This medication can be found over-the-counter  Carafate - for stomach upset and to protect your stomach  Take any prescribed medications only as directed.  Home care instructions:  Follow any educational materials contained in this packet. Please avoid excessive amounts of Tylenol, NSAID medication, alcohol  Follow-up instructions: Please follow-up with your primary care provider in the next 7 days for further evaluation of your symptoms.    Return instructions:  SEEK IMMEDIATE MEDICAL ATTENTION IF: The pain does not go away or becomes severe  A temperature above 101F develops  Repeated vomiting occurs (multiple episodes)  The pain becomes localized to portions of the abdomen. The right side could possibly be appendicitis. In an adult, the left lower portion of the abdomen could be colitis or diverticulitis.  Blood is being passed in stools or vomit (bright red or black tarry stools)  You develop chest pain, difficulty breathing, dizziness or fainting, or become confused, poorly responsive, or inconsolable (young children) If you have  any other emergent concerns regarding your health  Additional Information: Abdominal (belly) pain can be caused by many things. Your caregiver performed an examination and possibly ordered blood/urine tests and imaging (CT scan, x-rays, ultrasound). Many cases can be observed and treated at home after initial evaluation in the emergency department. Even though you are being discharged home, abdominal pain can be unpredictable. Therefore, you need a repeated exam if your pain does not resolve, returns, or worsens. Most patients with abdominal pain don't have to be admitted to the hospital or have surgery, but serious problems like appendicitis and gallbladder attacks can start out as nonspecific pain. Many abdominal conditions cannot be diagnosed in one visit, so follow-up evaluations are very important.  Your vital signs today were: BP 123/79   Pulse 77   Temp 98.3 F (36.8 C)   Resp 16   Ht 5\' 4"  (1.626 m)   Wt 106.6 kg   SpO2 100%   BMI 40.34 kg/m  If your blood pressure (bp) was elevated above 135/85 this visit, please have this repeated by your doctor within one month. --------------

## 2021-07-03 ENCOUNTER — Telehealth: Payer: Self-pay

## 2021-07-03 NOTE — Telephone Encounter (Signed)
Transition Care Management Unsuccessful Follow-up Telephone Call  Date of discharge and from where:  07/01/2021-[facility]  Attempts:  1st Attempt  Reason for unsuccessful TCM follow-up call:  Unable to leave message

## 2021-07-06 NOTE — Telephone Encounter (Signed)
Transition Care Management Unsuccessful Follow-up Telephone Call  Date of discharge and from where:  07/01/2021-[facility]  Attempts:  2nd Attempt  Reason for unsuccessful TCM follow-up call:  Unable to leave message

## 2021-07-07 NOTE — Telephone Encounter (Signed)
Transition Care Management Unsuccessful Follow-up Telephone Call  Date of discharge and from where:  07/01/2021 from [facility]  Attempts:  3rd Attempt  Reason for unsuccessful TCM follow-up call:  Unable to reach patient

## 2021-08-15 ENCOUNTER — Encounter (HOSPITAL_BASED_OUTPATIENT_CLINIC_OR_DEPARTMENT_OTHER): Payer: Self-pay | Admitting: *Deleted

## 2021-08-15 ENCOUNTER — Other Ambulatory Visit: Payer: Self-pay

## 2021-08-15 ENCOUNTER — Emergency Department (HOSPITAL_BASED_OUTPATIENT_CLINIC_OR_DEPARTMENT_OTHER): Payer: Medicaid Other | Admitting: Radiology

## 2021-08-15 ENCOUNTER — Emergency Department (HOSPITAL_BASED_OUTPATIENT_CLINIC_OR_DEPARTMENT_OTHER)
Admission: EM | Admit: 2021-08-15 | Discharge: 2021-08-15 | Disposition: A | Discharge status: Home / Self Care | Payer: Medicaid Other | Attending: Emergency Medicine | Admitting: Emergency Medicine

## 2021-08-15 DIAGNOSIS — G43919 Migraine, unspecified, intractable, without status migrainosus: Secondary | ICD-10-CM | POA: Insufficient documentation

## 2021-08-15 DIAGNOSIS — I10 Essential (primary) hypertension: Secondary | ICD-10-CM | POA: Insufficient documentation

## 2021-08-15 DIAGNOSIS — R519 Headache, unspecified: Secondary | ICD-10-CM | POA: Diagnosis present

## 2021-08-15 DIAGNOSIS — R0789 Other chest pain: Secondary | ICD-10-CM | POA: Diagnosis not present

## 2021-08-15 LAB — PREGNANCY, URINE: Preg Test, Ur: NEGATIVE

## 2021-08-15 LAB — COMPREHENSIVE METABOLIC PANEL
ALT: 49 U/L — ABNORMAL HIGH (ref 0–44)
AST: 32 U/L (ref 15–41)
Albumin: 3.9 g/dL (ref 3.5–5.0)
Alkaline Phosphatase: 125 U/L (ref 38–126)
Anion gap: 8 (ref 5–15)
BUN: 12 mg/dL (ref 6–20)
CO2: 28 mmol/L (ref 22–32)
Calcium: 9 mg/dL (ref 8.9–10.3)
Chloride: 102 mmol/L (ref 98–111)
Creatinine, Ser: 0.71 mg/dL (ref 0.44–1.00)
GFR, Estimated: >60 mL/min (ref >60)
Glucose, Bld: 123 mg/dL — ABNORMAL HIGH (ref 70–99)
Potassium: 3.7 mmol/L (ref 3.5–5.1)
Sodium: 138 mmol/L (ref 135–145)
Total Bilirubin: 0.3 mg/dL (ref 0.3–1.2)
Total Protein: 7.2 g/dL (ref 6.5–8.1)

## 2021-08-15 LAB — CBC WITH DIFFERENTIAL/PLATELET
Abs Immature Granulocytes: 0.03 10*3/uL (ref 0.00–0.07)
Basophils Absolute: 0 10*3/uL (ref 0.0–0.1)
Basophils Relative: 1 %
Eosinophils Absolute: 0.2 10*3/uL (ref 0.0–0.5)
Eosinophils Relative: 4 %
HCT: 39.4 % (ref 36.0–46.0)
Hemoglobin: 12.5 g/dL (ref 12.0–15.0)
Immature Granulocytes: 1 %
Lymphocytes Relative: 48 %
Lymphs Abs: 2.5 10*3/uL (ref 0.7–4.0)
MCH: 26.9 pg (ref 26.0–34.0)
MCHC: 31.7 g/dL (ref 30.0–36.0)
MCV: 84.7 fL (ref 80.0–100.0)
Monocytes Absolute: 0.3 10*3/uL (ref 0.1–1.0)
Monocytes Relative: 5 %
Neutro Abs: 2.2 10*3/uL (ref 1.7–7.7)
Neutrophils Relative %: 41 %
Platelets: 279 10*3/uL (ref 150–400)
RBC: 4.65 MIL/uL (ref 3.87–5.11)
RDW: 13.1 % (ref 11.5–15.5)
WBC: 5.2 10*3/uL (ref 4.0–10.5)
nRBC: 0 % (ref 0.0–0.2)

## 2021-08-15 LAB — TROPONIN I (HIGH SENSITIVITY)
Troponin I (High Sensitivity): <2 ng/L (ref <18)
Troponin I (High Sensitivity): <2 ng/L (ref <18)

## 2021-08-15 MED ORDER — SODIUM CHLORIDE 0.9 % IV BOLUS
500.0000 mL | Freq: Once | INTRAVENOUS | Status: AC
Start: 2021-08-15 — End: 2021-08-15
  Administered 2021-08-15: 500 mL via INTRAVENOUS

## 2021-08-15 MED ORDER — PROCHLORPERAZINE EDISYLATE 10 MG/2ML IJ SOLN
10.0000 mg | Freq: Once | INTRAMUSCULAR | Status: AC
  Administered 2021-08-15: 10 mg via INTRAVENOUS
  Filled 2021-08-15: qty 2

## 2021-08-15 MED ORDER — DIPHENHYDRAMINE HCL 50 MG/ML IJ SOLN
12.5000 mg | Freq: Once | INTRAMUSCULAR | Status: AC
Start: 2021-08-15 — End: 2021-08-15
  Administered 2021-08-15: 12.5 mg via INTRAVENOUS
  Filled 2021-08-15: qty 1

## 2021-08-15 MED ORDER — KETOROLAC TROMETHAMINE 15 MG/ML IJ SOLN
15.0000 mg | Freq: Once | INTRAMUSCULAR | Status: AC
  Administered 2021-08-15: 15 mg via INTRAVENOUS
  Filled 2021-08-15: qty 1

## 2021-08-15 NOTE — ED Provider Notes (Signed)
[facility] EMERGENCY DEPT Provider Note   CSN: [id number] Arrival date & time: 08/15/21  1236     History  Chief Complaint  Patient presents with   Hypertension    Amy Henry is a 41 y.o. female with history of PCOS who presents emergency department with concern of high blood pressure, and headache.  Patient recently stopped spironolactone due to insurance, she was on this previously for acne associated with PCOS.  She states since stopping this medication she has had a headache with some intermittent chest discomfort.  She believed it was related to her blood pressure being high.  She tried herbal remedies at home with no relief.  She states she has had headaches in the past, and this feels different than prior.   Hypertension Associated symptoms include headaches. Pertinent negatives include no chest pain, no abdominal pain and no shortness of breath.     Past Medical History:  Diagnosis Date   PCOS (polycystic ovarian syndrome)      Home Medications Prior to Admission medications   Medication Sig Start Date End Date Taking? Authorizing Provider  famotidine (PEPCID) 20 MG tablet Take 1 tablet (20 mg total) by mouth 2 (two) times daily. 07/01/21   Carlisle Cater, PA-C  ipratropium (ATROVENT) 0.03 % nasal spray Place into the nose. 10/06/20   [provider]  ipratropium (ATROVENT) 0.03 % nasal spray Place into both nostrils. 10/06/20   [provider]  metFORMIN (GLUCOPHAGE) 1000 MG tablet Take by mouth.    [provider]  metroNIDAZOLE (FLAGYL) 500 MG tablet Take 1 tablet (500 mg total) by mouth 2 (two) times daily. 10/21/20   Lajean Saver, MD  Norgestimate-Ethinyl Estradiol Triphasic (TRI-LO-MARZIA) 0.18/0.215/0.25 MG-25 MCG tab Take 1 tablet by mouth daily. 10/17/20   [provider]  omeprazole (PRILOSEC) 20 MG capsule Take 1 capsule (20 mg total) by mouth daily. 07/01/21   Carlisle Cater, PA-C  spironolactone (ALDACTONE)  50 MG tablet Take 1 tablet by mouth daily. 04/18/20 04/18/21  [provider]  sucralfate (CARAFATE) 1 g tablet Take 1 tablet (1 g total) by mouth 4 (four) times daily -  with meals and at bedtime. 07/01/21   Carlisle Cater, PA-C      Allergies    Patient has no known allergies.    Review of Systems   Review of Systems  Constitutional:  Negative for chills and fever.  HENT:  Negative for congestion.   Eyes:  Positive for photophobia.  Respiratory:  Negative for cough and shortness of breath.   Cardiovascular:  Negative for chest pain.  Gastrointestinal:  Negative for abdominal pain, constipation, diarrhea, nausea and vomiting.  Genitourinary:  Negative for dysuria and flank pain.  Musculoskeletal:  Negative for myalgias.  Neurological:  Positive for headaches. Negative for dizziness, tremors, seizures, syncope, facial asymmetry, speech difficulty, weakness, light-headedness and numbness.  All other systems reviewed and are negative.  Physical Exam Updated Vital Signs BP (!) 102/51 (BP Location: Right Arm)    Pulse 84    Temp 98.4 F (36.9 C) (Oral)    Resp 20    Ht 5\' 4"  (1.626 m)    Wt 108.9 kg    LMP 08/04/2021 (Approximate)    SpO2 100%    BMI 41.20 kg/m  Physical Exam Vitals and nursing note reviewed.  Constitutional:      Appearance: Normal appearance.  HENT:     Head: Normocephalic and atraumatic.  Eyes:     Conjunctiva/sclera: Conjunctivae normal.  Cardiovascular:  Rate and Rhythm: Normal rate and regular rhythm.  Pulmonary:     Effort: Pulmonary effort is normal. No respiratory distress.     Breath sounds: Normal breath sounds.  Abdominal:     General: There is no distension.     Palpations: Abdomen is soft.     Tenderness: There is no abdominal tenderness.  Musculoskeletal:     Right lower leg: No edema.     Left lower leg: No edema.  Skin:    General: Skin is warm and dry.  Neurological:     General: No focal deficit present.     Mental Status: She  is alert.     Comments: Neuro: Speech is clear, able to follow commands. CN III-XII intact grossly intact. PERRLA. EOMI. Sensation intact throughout. Str 5/5 all extremities.     ED Results / Procedures / Treatments   Labs (all labs ordered are listed, but only abnormal results are displayed) Labs Reviewed  COMPREHENSIVE METABOLIC PANEL - Abnormal; Notable for the following components:      Result Value   Glucose, Bld 123 (*)    ALT 49 (*)    All other components within normal limits  CBC WITH DIFFERENTIAL/PLATELET  PREGNANCY, URINE  TROPONIN I (HIGH SENSITIVITY)  TROPONIN I (HIGH SENSITIVITY)    EKG EKG Interpretation  Date/Time:  Saturday August 15 2021 13:01:10 EST Ventricular Rate:  70 PR Interval:  180 QRS Duration: 90 QT Interval:  360 QTC Calculation: 388 R Axis:   -3 Text Interpretation: Normal sinus rhythm Normal ECG No previous ECGs available Confirmed by Campbell Stall ([id number]) on 04/13/6386 4:08:11 PM  Radiology DG Chest 2 View  Result Date: 08/15/2021 CLINICAL DATA:  C/o HA and chest discomfort, believes it is r/t high BP, recently stopped spironolactone for acne (stopped d/t insurance). Sx onset 1 week ago, worse in last 2 days. EXAM: CHEST - 2 VIEW COMPARISON:  07/01/2021. FINDINGS: Normal heart, mediastinum and hila. Clear lungs.  No pleural effusion or pneumothorax. Skeletal structures are unremarkable. IMPRESSION: No active cardiopulmonary disease. Electronically Signed   By: Lajean Manes M.D.   On: 08/15/2021 13:48    Procedures Procedures    Medications Ordered in ED Medications  sodium chloride 0.9 % bolus 500 mL (0 mLs Intravenous Stopped 08/15/21 1727)  ketorolac (TORADOL) 15 MG/ML injection 15 mg (15 mg Intravenous Given 08/15/21 1659)  prochlorperazine (COMPAZINE) injection 10 mg (10 mg Intravenous Given 08/15/21 1658)  diphenhydrAMINE (BENADRYL) injection 12.5 mg (12.5 mg Intravenous Given 08/15/21 1658)    ED Course/ Medical Decision Making/ A&P                            Medical Decision Making This patient presents to the ED for concern of headache, this involves an extensive number of treatment options, and is a complaint that carries with it a high risk of complications and morbidity. The emergent differential diagnosis includes, but is not limited to, subarachnoid hemorrhage, meningitis, temporal arteritis, glaucoma, cerebral ischemia, carotid/vertebral dissection, intracranial tumor, Venous sinus thrombosis, carbon monoxide poisoning, acute or chronic subdural hemorrhage.  Other considerations include: Migraine, Cluster headache, Hypertension, Caffeine, alcohol, or drug withdrawal, Pseudotumor cerebri, Arteriovenous malformation, Head injury, Neurocysticercosis, Post-lumbar puncture, Preeclampsia, Tension headache, Sinusitis, Cervical arthritis, Refractive error causing strain, Dental abscess, Otitis media, Temporomandibular joint syndrome, Depression, Somatoform disorder (eg, somatization) Trigeminal neuralgia, Glossopharyngeal neuralgia.   Co morbidities that complicate the patient evaluation: PCOS  Physical exam performed. The pertinent  findings include: Left-sided throbbing headache, with associated photophobia and mild nausea.  Heart regular rate and rhythm, lung sounds clear in all fields.  Lab Tests: I Ordered, and personally interpreted labs.  The pertinent results include: No leukocytosis, no anemia, electrolytes within normal limits.  No hypo or hyperglycemia.  Troponin negative.   Imaging Studies ordered: I ordered imaging studies including chest x-ray in the setting of intermittent chest pain. I independently visualized and interpreted imaging which showed no acute cardiopulmonary abnormalities. I agree with the radiologist interpretation.   Cardiac Monitoring: The patient was maintained on a cardiac monitor.  My attending physician Dr. Pearline Cables viewed and interpreted the cardiac monitored which showed an underlying rhythm of: Normal sinus  rhythm.   Medicines ordered and prescription drug management: I ordered medication including IV fluids, Toradol, antiemetics for likely migraine. Reevaluation of the patient after these medicines showed that the patient resolved. I have reviewed the patients home medicines and have made adjustments as needed.  Dispostion: After consideration of the diagnostic results and the patients response to treatment, I feel that patient is not requiring admission or inpatient treatment for her symptoms.  She initially had concern of elevated blood pressure, her blood pressure has remained stable while during her 5-hour period of observation in the emergency department.  She has had no recurrent episodes of chest pain.  Her chest pain was never exertional, not associated with shortness of breath.  She is PERC negative for PE.  We discussed blood pressure changes after stopping her spironolactone.  It is clinically reassuring that she had improvement of her headache with a migraine cocktail.  I have low suspicion for other acute etiologies for her symptoms as above.  She is stable for discharge to home, and encourage good follow-up with her PCP about long-term PCOS management.  Discussed reasons return to the emergency department.  Patient is agreeable to the plan.  Final Clinical Impression(s) / ED Diagnoses Final diagnoses:  Intractable migraine without status migrainosus, unspecified migraine type    Rx / DC Orders ED Discharge Orders     None      Portions of this report may have been transcribed using voice recognition software. Every effort was made to ensure accuracy; however, inadvertent computerized transcription errors may be present.    Estill Cotta 08/15/21 1820    Blanchie Dessert, MD 08/15/21 2324

## 2021-08-15 NOTE — ED Triage Notes (Signed)
C/o HA and chest discomfort, believes it is r/t high BP, recently stopped spironolactone for acne (stopped d/t insurance). Sx onset 1 week ago, worse in last 2 days.

## 2021-08-15 NOTE — Discharge Instructions (Addendum)
You are seen in the emergency department today for high blood pressure and headache.  As we discussed I think your symptoms are like related to a migraine, and I am so glad that we had improvement with the medications we gave you.  You can continue taking over-the-counter medications at home as needed.  I like you to follow-up with your primary doctor/OB/GYN to discuss your long-term PCOS management.  Continue to monitor how you're doing and return to the ER for new or worsening symptoms such as persistent headache, vision changes, chest pain, dizziness.   It has been a pleasure seeing and caring for you today and I hope you start feeling better soon!

## 2021-10-20 ENCOUNTER — Other Ambulatory Visit (HOSPITAL_BASED_OUTPATIENT_CLINIC_OR_DEPARTMENT_OTHER): Payer: Self-pay

## 2021-11-09 ENCOUNTER — Ambulatory Visit: Payer: Medicaid Other | Admitting: Nurse Practitioner

## 2021-12-11 ENCOUNTER — Emergency Department (HOSPITAL_BASED_OUTPATIENT_CLINIC_OR_DEPARTMENT_OTHER)
Admission: EM | Admit: 2021-12-11 | Discharge: 2021-12-11 | Disposition: A | Discharge status: Home / Self Care | Payer: Medicaid Other | Attending: Emergency Medicine | Admitting: Emergency Medicine

## 2021-12-11 ENCOUNTER — Encounter (HOSPITAL_BASED_OUTPATIENT_CLINIC_OR_DEPARTMENT_OTHER): Payer: Self-pay

## 2021-12-11 ENCOUNTER — Other Ambulatory Visit: Payer: Self-pay

## 2021-12-11 DIAGNOSIS — Z202 Contact with and (suspected) exposure to infections with a predominantly sexual mode of transmission: Secondary | ICD-10-CM | POA: Insufficient documentation

## 2021-12-11 LAB — URINALYSIS, ROUTINE W REFLEX MICROSCOPIC
Bilirubin Urine: NEGATIVE
Glucose, UA: NEGATIVE mg/dL
Hgb urine dipstick: NEGATIVE
Ketones, ur: NEGATIVE mg/dL
Leukocytes,Ua: NEGATIVE
Nitrite: NEGATIVE
Protein, ur: NEGATIVE mg/dL
Specific Gravity, Urine: 1.024 (ref 1.005–1.030)
pH: 6 (ref 5.0–8.0)

## 2021-12-11 LAB — PREGNANCY, URINE: Preg Test, Ur: NEGATIVE

## 2021-12-11 MED ORDER — DOXYCYCLINE HYCLATE 100 MG PO CAPS: 100.0000 mg | ORAL_CAPSULE | Freq: Two times a day (BID) | ORAL | 0 refills | Status: DC

## 2021-12-11 NOTE — ED Provider Notes (Addendum)
?[facility] EMERGENCY DEPT ?Provider Note ? ? ?CSN: [id number] ?Arrival date & time: 12/11/21  1757 ? ?  ? ?History ? ?Chief Complaint  ?Patient presents with  ? Exposure to STD  ? ? ?Amy Henry is a 41 y.o. female. ? ?Patient with no pertinent past medical history presents today with complaints of STD exposure. She states that her partner that she regularly has unprotected intercourse with just tested positive for Chlamydia. She denies any pain, discharge, or any other symptoms. ? ?The history is provided by the patient. No language interpreter was used.  ?Exposure to STD ? ? ?  ? ?Home Medications ?Prior to Admission medications   ?Medication Sig Start Date End Date Taking? Authorizing Provider  ?famotidine (PEPCID) 20 MG tablet Take 1 tablet (20 mg total) by mouth 2 (two) times daily. 07/01/21   Carlisle Cater, PA-C  ?ipratropium (ATROVENT) 0.03 % nasal spray Place into the nose. 10/06/20   [provider]  ?ipratropium (ATROVENT) 0.03 % nasal spray Place into both nostrils. 10/06/20   [provider]  ?metFORMIN (GLUCOPHAGE) 1000 MG tablet Take by mouth.    [provider]  ?metroNIDAZOLE (FLAGYL) 500 MG tablet Take 1 tablet (500 mg total) by mouth 2 (two) times daily. 10/21/20   Lajean Saver, MD  ?Norgestimate-Ethinyl Estradiol Triphasic (TRI-LO-MARZIA) 0.18/0.215/0.25 MG-25 MCG tab Take 1 tablet by mouth daily. 10/17/20   [provider]  ?omeprazole (PRILOSEC) 20 MG capsule Take 1 capsule (20 mg total) by mouth daily. 07/01/21   Carlisle Cater, PA-C  ?spironolactone (ALDACTONE) 50 MG tablet Take 1 tablet by mouth daily. 04/18/20 04/18/21  [provider]  ?sucralfate (CARAFATE) 1 g tablet Take 1 tablet (1 g total) by mouth 4 (four) times daily -  with meals and at bedtime. 07/01/21   Carlisle Cater, PA-C  ?   ? ?Allergies    ?Patient has no known allergies.   ? ?Review of Systems   ?Review of Systems  ?All other systems reviewed and are  negative. ? ?Physical Exam ?Updated Vital Signs ?BP (!) 121/96 (BP Location: Right Arm)   Pulse 82   Temp 98.2 ?F (36.8 ?C) (Oral)   Resp 16   Ht '5\' 4"'$  (1.626 m)   Wt 108.9 kg   SpO2 97%   BMI 41.21 kg/m?  ?Physical Exam ?Vitals and nursing note reviewed.  ?Constitutional:   ?   General: She is not in acute distress. ?   Appearance: Normal appearance. She is normal weight. She is not ill-appearing, toxic-appearing or diaphoretic.  ?HENT:  ?   Head: Normocephalic and atraumatic.  ?Cardiovascular:  ?   Rate and Rhythm: Normal rate.  ?Pulmonary:  ?   Effort: Pulmonary effort is normal. No respiratory distress.  ?Abdominal:  ?   General: Abdomen is flat.  ?   Palpations: Abdomen is soft.  ?Musculoskeletal:     ?   General: Normal range of motion.  ?   Cervical back: Normal range of motion.  ?Skin: ?   General: Skin is warm and dry.  ?Neurological:  ?   General: No focal deficit present.  ?   Mental Status: She is alert.  ?Psychiatric:     ?   Mood and Affect: Mood normal.     ?   Behavior: Behavior normal.  ? ? ?ED Results / Procedures / Treatments   ?Labs ?(all labs ordered are listed, but only abnormal results are displayed) ?Labs Reviewed  ?URINALYSIS, ROUTINE W REFLEX MICROSCOPIC  ?PREGNANCY,  URINE  ?GC/CHLAMYDIA PROBE AMP ([place]) NOT AT [facility]  ? ? ?EKG ?None ? ?Radiology ?No results found. ? ?Procedures ?Procedures  ? ? ?Medications Ordered in ED ?Medications - No data to display ? ?ED Course/ Medical Decision Making/ A&P ?  ?                        ?Medical Decision Making ?Amount and/or Complexity of Data Reviewed ?Labs: ordered. ? ?Risk ?Prescription drug management. ? ? ?Pt presents with concerns for possible STD.  Pt understands that they have GC/Chlamydia cultures pending and that they will need to inform all sexual partners if results return positive. Pt has been treated prophylactically with doxycycline due to pts known exposure. Pt not concerning for PID because hemodynamically stable and  asymptomatic. Patient to be discharged with instructions to follow up with OBGYN/PCP. Discussed importance of using protection when sexually active.  Patient understanding and amenable with plan, discharged in stable condition. ? ?Final Clinical Impression(s) / ED Diagnoses ?Final diagnoses:  ?STD exposure  ? ? ?Rx / DC Orders ?ED Discharge Orders   ? ?      Ordered  ?  doxycycline (VIBRAMYCIN) 100 MG capsule  2 times daily       ? 12/11/21 1955  ? ?  ?  ? ?  ?An After Visit Summary was printed and given to the patient. ? ? ?  ?Bud Face, PA-C ?12/11/21 1959 ? ?  ?Bud Face, PA-C ?12/11/21 1959 ? ?  ?Dorie Rank, MD ?12/12/21 2121 ? ?

## 2021-12-11 NOTE — Discharge Instructions (Signed)
As we discussed, given your recent STD exposure I have given you a prescription for antibiotics for you to take as prescribed.  Your results are pending in [place] and should result in the next 24 to 48 hours.  Please monitor for the results of this.  If positive, you will need to inform all sexual partners of your positive test and abstain from sexual intercourse until you have a negative test of cure.  This can be performed at your OB/GYN. ? ?Return if development of any new or worsening symptoms. ?

## 2021-12-11 NOTE — ED Triage Notes (Signed)
Patient here POV from Home. ? ?Patient here for STD Screening. No Symptoms but endorses Sexual Encounter with Someone who tested Positive for STD. ? ?NAD Noted during Triage. A&Ox4. GCS 15. Ambulatory.  ?

## 2021-12-14 LAB — GC/CHLAMYDIA PROBE AMP (~~LOC~~) NOT AT ARMC
Chlamydia: NEGATIVE
Comment: NEGATIVE
Comment: NORMAL
Neisseria Gonorrhea: NEGATIVE

## 2021-12-21 IMAGING — US US PELVIS COMPLETE TRANSABD/TRANSVAG W DUPLEX
1 series · 13 of 25 positions shown · non-contrast
Comparison: None.

CLINICAL DATA: Post left lower quadrant pain

EXAM:
TRANSABDOMINAL AND TRANSVAGINAL ULTRASOUND OF PELVIS
DOPPLER ULTRASOUND OF OVARIES
TECHNIQUE: Both transabdominal and transvaginal ultrasound examinations of the
pelvis were performed. Transabdominal technique was performed for
global imaging of the pelvis including uterus, ovaries, adnexal
regions, and pelvic cul-de-sac.
It was necessary to proceed with endovaginal exam following the
transabdominal exam to visualize the left ovary and adnexa. Color
and duplex Doppler ultrasound was utilized to evaluate blood flow to
the ovaries.

[Series 1: us pelvis (transabdominal only) · 13 of 76 slices shown]
[im 1/76]
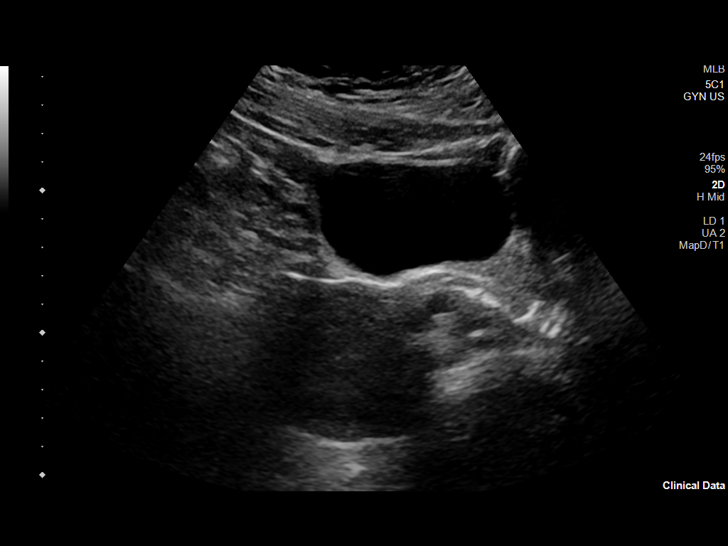
[im 7/76]
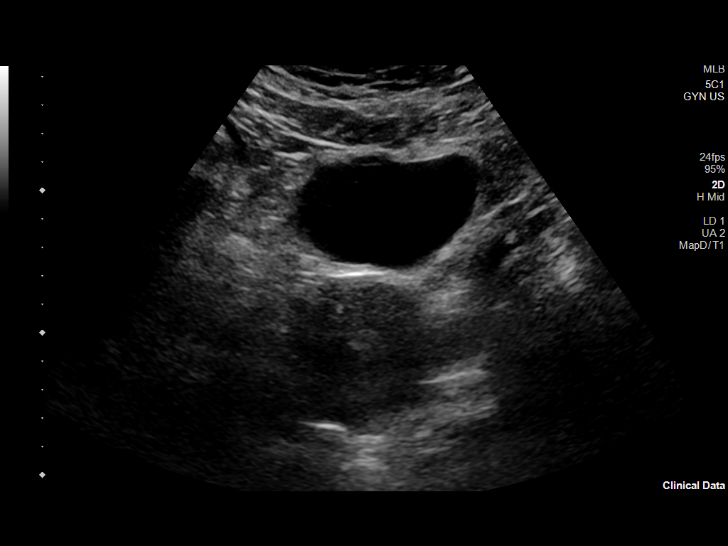
[im 13/76]
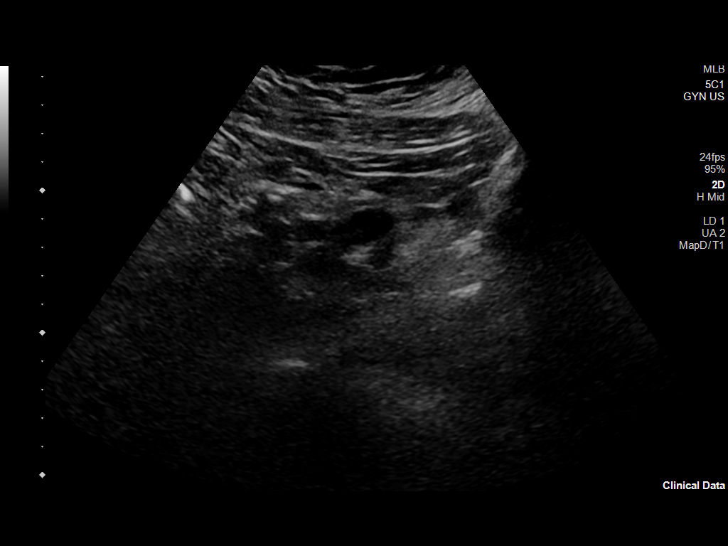
[im 19/76]
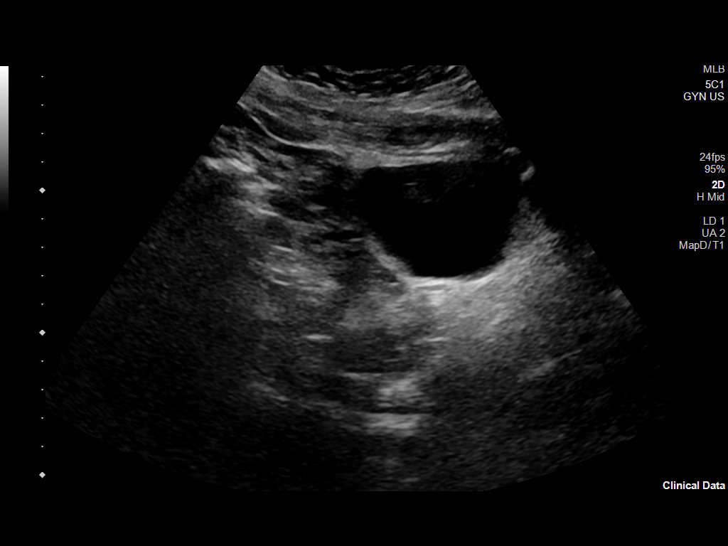
[im 26/76]
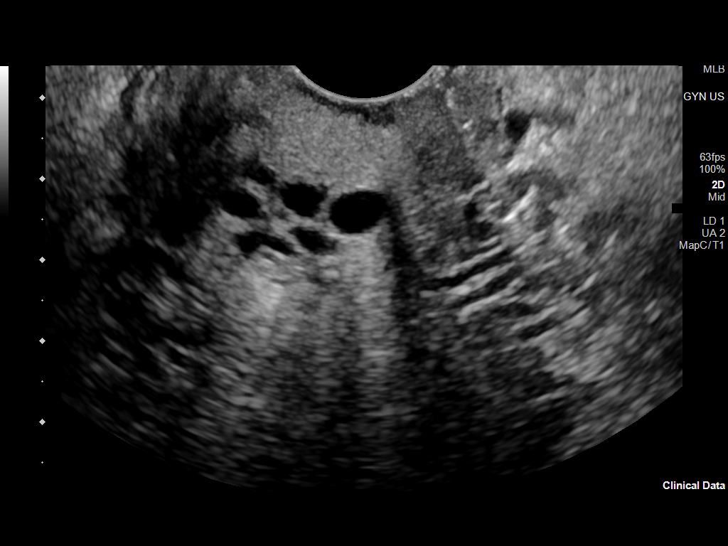
[im 32/76]
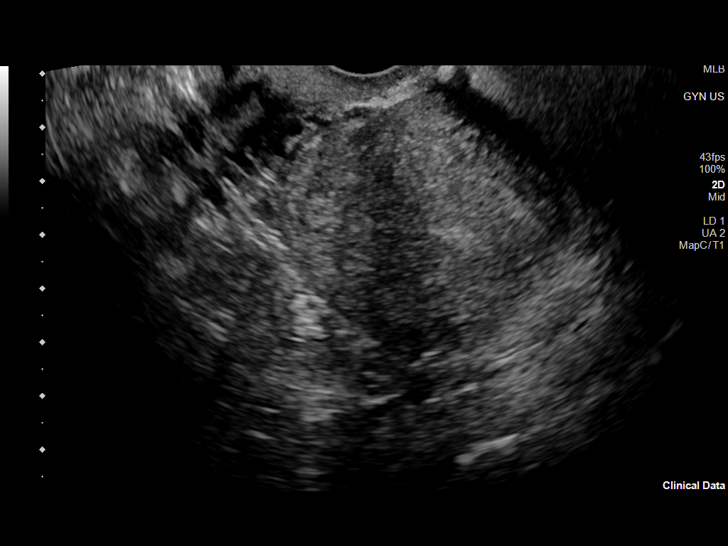
[im 38/76]
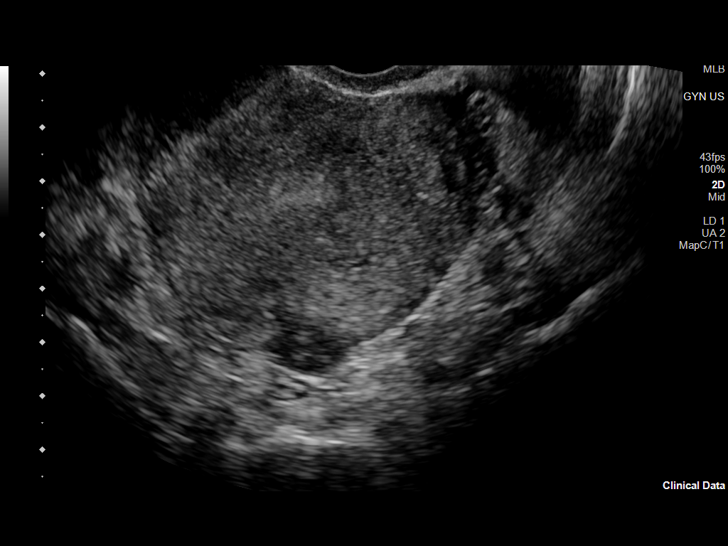
[im 44/76]
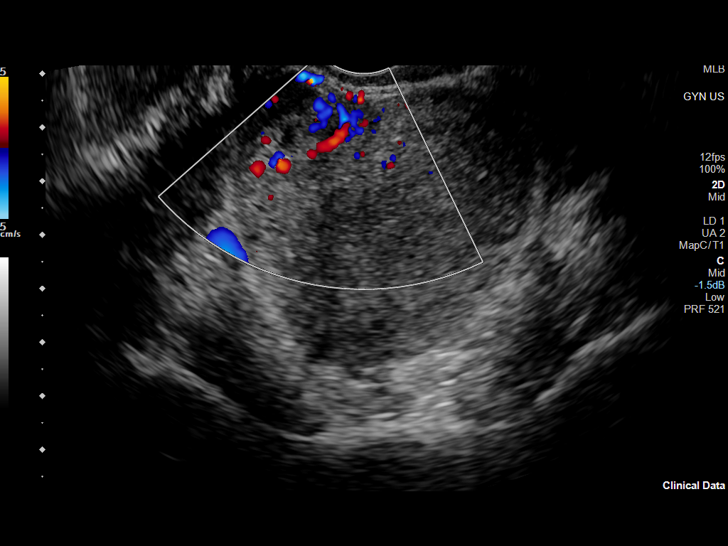
[im 51/76]
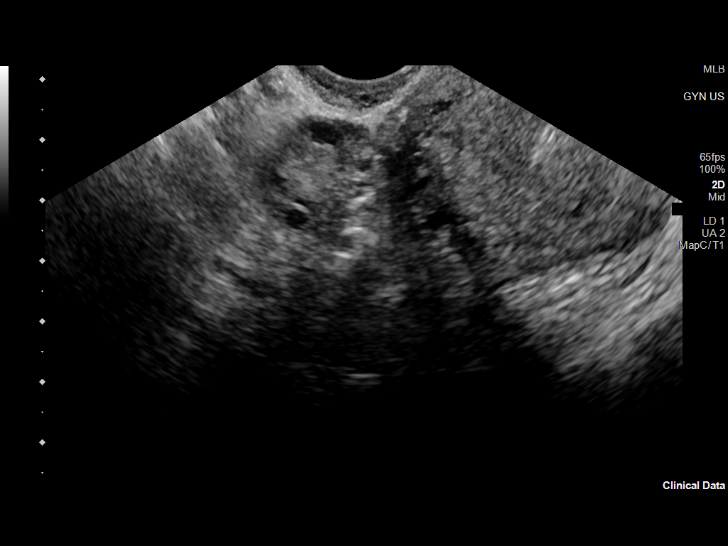
[im 57/76]
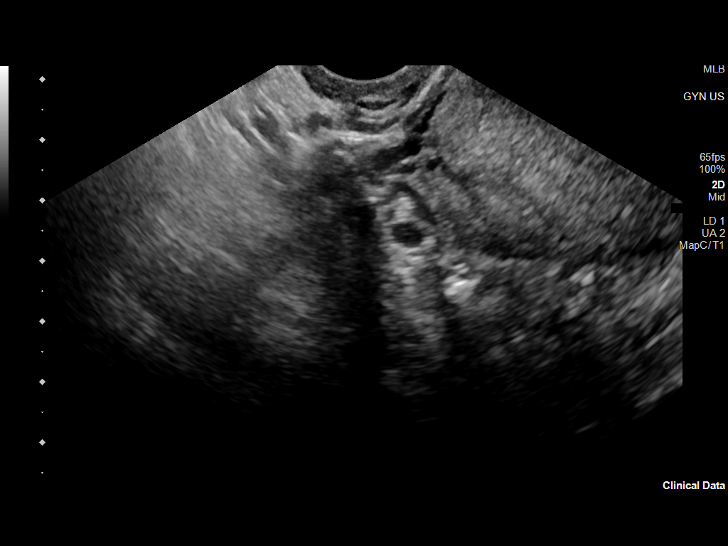
[im 63/76]
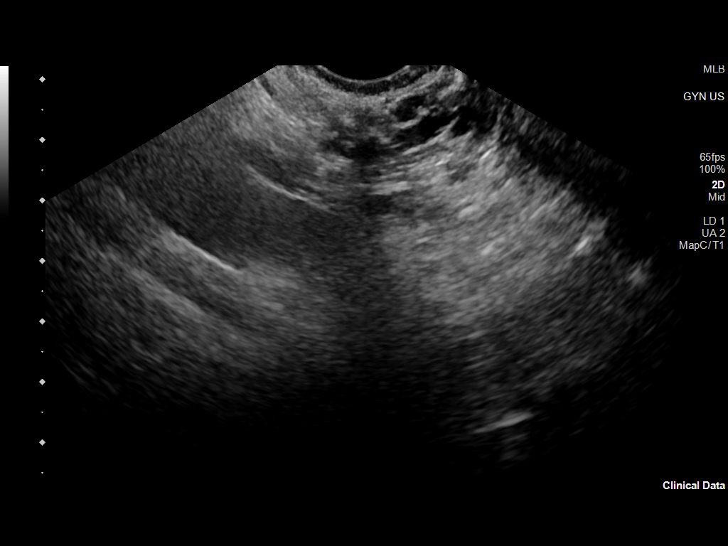
[im 69/76]
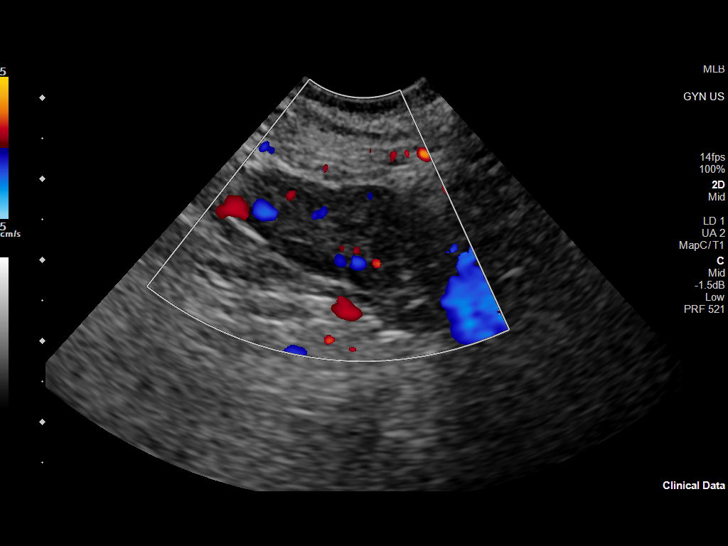
[im 76/76]
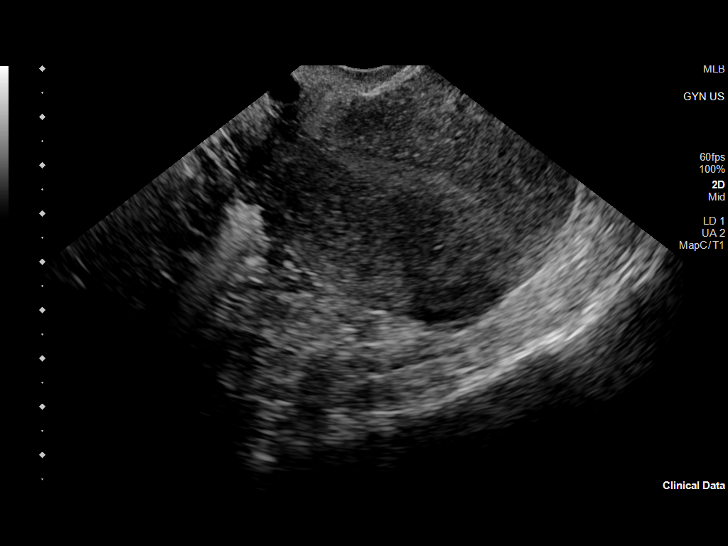

[13 of 25 positions shown; findings below may reference images not displayed]

FINDINGS: Uterus

Measurements: 8.6 x 5.8 x 5.8 cm = volume: 151.3 mL. 1.7 x 1.8 x 1
cm subserosal fibroid at the fundus. 1.6 x 2 x 1.1 cm intramural
fibroid at the ventral body.

Endometrium

Thickness: 8 mm.  No focal abnormality visualized.

Right ovary

Measurements: 2.3 x 1 x 1.5 cm = volume: 3.3 mL. Normal
appearance/no adnexal mass.

Left ovary

Measurements: 2.2 x 2.4 x 2.2 cm = volume: 3.9 mL. Normal
appearance/no adnexal mass.

Pulsed Doppler evaluation of both ovaries demonstrates normal
low-resistance arterial and venous waveforms.

Other findings

No abnormal free fluid.
IMPRESSION: No evidence of ovarian torsion.

Two small uterine fibroids.

## 2022-07-16 ENCOUNTER — Other Ambulatory Visit: Payer: Self-pay | Admitting: Surgery

## 2022-08-18 ENCOUNTER — Encounter (HOSPITAL_COMMUNITY): Payer: Self-pay

## 2022-08-18 ENCOUNTER — Other Ambulatory Visit: Payer: Self-pay

## 2022-08-18 ENCOUNTER — Emergency Department (HOSPITAL_COMMUNITY)
Admission: EM | Admit: 2022-08-18 | Discharge: 2022-08-18 | Disposition: A | Discharge status: Home / Self Care | Payer: 59 | Attending: Emergency Medicine | Admitting: Emergency Medicine

## 2022-08-18 DIAGNOSIS — Z1152 Encounter for screening for COVID-19: Secondary | ICD-10-CM | POA: Insufficient documentation

## 2022-08-18 DIAGNOSIS — R059 Cough, unspecified: Secondary | ICD-10-CM | POA: Diagnosis present

## 2022-08-18 DIAGNOSIS — Z7984 Long term (current) use of oral hypoglycemic drugs: Secondary | ICD-10-CM | POA: Insufficient documentation

## 2022-08-18 DIAGNOSIS — J069 Acute upper respiratory infection, unspecified: Secondary | ICD-10-CM | POA: Insufficient documentation

## 2022-08-18 DIAGNOSIS — B9789 Other viral agents as the cause of diseases classified elsewhere: Secondary | ICD-10-CM | POA: Diagnosis not present

## 2022-08-18 LAB — RESP PANEL BY RT-PCR (RSV, FLU A&B, COVID)  RVPGX2
Influenza A by PCR: NEGATIVE
Influenza B by PCR: NEGATIVE
Resp Syncytial Virus by PCR: NEGATIVE
SARS Coronavirus 2 by RT PCR: NEGATIVE

## 2022-08-18 MED ORDER — OSELTAMIVIR PHOSPHATE 75 MG PO CAPS
75.0000 mg | ORAL_CAPSULE | Freq: Two times a day (BID) | ORAL | 0 refills | Status: DC
Start: 2022-08-18 — End: 2022-10-04

## 2022-08-18 NOTE — Discharge Instructions (Addendum)
Please check MyChart for your COVID and flu test.  If it is positive for flu a and B you can start taking Tamiflu.  You can also try Mucinex and TheraFlu for some relief.  Follow-up with your primary care physician for further evaluation management.  Return to the ER if new or worsening symptoms.

## 2022-08-18 NOTE — ED Triage Notes (Signed)
Pt states that she has been having flu like symptoms since this morning. Pt states that her son had the flu and her husband had rsv.

## 2022-08-18 NOTE — ED Provider Notes (Signed)
[facility] DEPT Provider Note   CSN: [id number] Arrival date & time: 08/18/22  1939     History  Chief Complaint  Patient presents with   Cough   Nasal Congestion   Generalized Body Aches   Amy Henry is a 42 y.o. female with a past medical history of PCOS presents today for evaluation of flulike symptoms.  Patient states she started to have cough, runny nose, body aches since this morning.  Patient's husband has RSV and patient's son has the flu.  No fever, chest pain or shortness of breath.   Cough  Past Medical History:  Diagnosis Date   PCOS (polycystic ovarian syndrome)    Past Surgical History:  Procedure Laterality Date   OVARIAN CYST REMOVAL        Home Medications Prior to Admission medications   Medication Sig Start Date End Date Taking? Authorizing Provider  oseltamivir (TAMIFLU) 75 MG capsule Take 1 capsule (75 mg total) by mouth every 12 (twelve) hours. 08/18/22  Yes Rex Kras, PA  doxycycline (VIBRAMYCIN) 100 MG capsule Take 1 capsule (100 mg total) by mouth 2 (two) times daily. 12/11/21   Smoot, Leary Roca, PA-C  famotidine (PEPCID) 20 MG tablet Take 1 tablet (20 mg total) by mouth 2 (two) times daily. 07/01/21   Carlisle Cater, PA-C  ipratropium (ATROVENT) 0.03 % nasal spray Place into the nose. 10/06/20   [provider]  ipratropium (ATROVENT) 0.03 % nasal spray Place into both nostrils. 10/06/20   [provider]  metFORMIN (GLUCOPHAGE) 1000 MG tablet Take by mouth.    [provider]  metroNIDAZOLE (FLAGYL) 500 MG tablet Take 1 tablet (500 mg total) by mouth 2 (two) times daily. 10/21/20   Lajean Saver, MD  Norgestimate-Ethinyl Estradiol Triphasic (TRI-LO-MARZIA) 0.18/0.215/0.25 MG-25 MCG tab Take 1 tablet by mouth daily. 10/17/20   [provider]  omeprazole (PRILOSEC) 20 MG capsule Take 1 capsule (20 mg total) by mouth daily. 07/01/21   Carlisle Cater, PA-C  spironolactone (ALDACTONE) 50 MG  tablet Take 1 tablet by mouth daily. 04/18/20 04/18/21  [provider]  sucralfate (CARAFATE) 1 g tablet Take 1 tablet (1 g total) by mouth 4 (four) times daily -  with meals and at bedtime. 07/01/21   Carlisle Cater, PA-C      Allergies    Patient has no known allergies.    Review of Systems   Review of Systems  Respiratory:  Positive for cough.     Physical Exam Updated Vital Signs BP (!) 152/92   Pulse 100   Temp 99.4 F (37.4 C) (Oral)   Resp 16   Ht '5\' 4"'$  (1.626 m)   Wt 118.8 kg   SpO2 100%   BMI 44.97 kg/m  Physical Exam Vitals and nursing note reviewed.  Constitutional:      Appearance: Normal appearance.  HENT:     Head: Normocephalic and atraumatic.     Mouth/Throat:     Mouth: Mucous membranes are moist.  Eyes:     General: No scleral icterus. Cardiovascular:     Rate and Rhythm: Normal rate and regular rhythm.     Pulses: Normal pulses.     Heart sounds: Normal heart sounds.  Pulmonary:     Effort: Pulmonary effort is normal.     Breath sounds: Normal breath sounds.  Abdominal:     General: Abdomen is flat.     Palpations: Abdomen is soft.     Tenderness: There is no  abdominal tenderness.  Musculoskeletal:        General: No deformity.  Skin:    General: Skin is warm.     Findings: No rash.  Neurological:     General: No focal deficit present.     Mental Status: She is alert.  Psychiatric:        Mood and Affect: Mood normal.     ED Results / Procedures / Treatments   Labs (all labs ordered are listed, but only abnormal results are displayed) Labs Reviewed  RESP PANEL BY RT-PCR (RSV, FLU A&B, COVID)  RVPGX2    EKG None  Radiology No results found.  Procedures Procedures    Medications Ordered in ED Medications - No data to display  ED Course/ Medical Decision Making/ A&P                           Medical Decision Making Risk Prescription drug management.   This patient presents to the ED for cough sore throat,  body aches, this involves an extensive number of treatment options, and is a complaint that carries with a high risk of complications and morbidity.  The differential diagnosis includes flu, COVID, RSV, strep, pharyngitis, bronchitis, pneumonia, infectious etiology.  This is not an exhaustive list.  Lab tests: I ordered and personally interpreted labs.  The pertinent results include: Viral panel pending.  Imaging studies:  Problem list/ ED course/ Critical interventions/ Medical management: HPI: See above Vital signs within normal range and stable throughout visit. Laboratory/imaging studies significant for: See above. On physical examination, patient is afebrile and appears in no acute distress. This patient presents with symptoms suspicious for flu. Based on history and physical doubt sinusitis. COVID test was pending. Do not suspect underlying cardiopulmonary process. I considered, but think unlikely, pneumonia. Patient is nontoxic appearing and not in need of emergent medical intervention. Patient told to self isolate at home until symptoms subside for 72 hours.  Given symptoms duration, I will give patient Tamiflu and tell patient to take it if viral panel is positive for flu.  Recommended patient to take TheraFlu or Mucinex for symptom relief.  Follow-up with primary care physician for further evaluation and management.  Return to the ER if new or worsening symptoms. I have reviewed the patient home medicines and have made adjustments as needed.  Cardiac monitoring/EKG: The patient was maintained on a cardiac monitor.  I personally reviewed and interpreted the cardiac monitor which showed an underlying rhythm of: sinus rhythm.  Additional history obtained: External records from outside source obtained and reviewed including: Chart review including previous notes, labs, imaging.  Consultations obtained:  Disposition Continued outpatient therapy. Follow-up with PCP recommended for  reevaluation of symptoms. Treatment plan discussed with patient.  Pt acknowledged understanding was agreeable to the plan. Worrisome signs and symptoms were discussed with patient, and patient acknowledged understanding to return to the ED if they noticed these signs and symptoms. Patient was stable upon discharge.   This chart was dictated using voice recognition software.  Despite best efforts to proofread,  errors can occur which can change the documentation meaning.          Final Clinical Impression(s) / ED Diagnoses Final diagnoses:  Viral upper respiratory tract infection    Rx / DC Orders ED Discharge Orders          Ordered    oseltamivir (TAMIFLU) 75 MG capsule  Every 12 hours  08/18/22 2014              Rex Kras, PA 08/19/22 0110    Drenda Freeze, MD 08/25/22 [phone]

## 2022-08-20 ENCOUNTER — Telehealth: Payer: Self-pay | Admitting: *Deleted

## 2022-08-20 NOTE — Patient Outreach (Signed)
  Care Coordination [facility] Note Transition Care Management Unsuccessful Follow-up Telephone Call  Date of discharge and from where:  08/18/22 from [facility]  Attempts:  1st Attempt  Reason for unsuccessful TCM follow-up call:  No answer/busy  Lurena Joiner RN, [place] RN Care Coordinator

## 2022-08-31 IMAGING — US US ABDOMEN LIMITED
1 series · 14 of 25 positions shown · non-contrast
Comparison: CT 10/21/2020

CLINICAL DATA: Epigastric pain transaminitis

EXAM:
ULTRASOUND ABDOMEN LIMITED RIGHT UPPER QUADRANT

[Series 1: us abdomen limited ruq (liver/gb) · 14 of 62 slices shown]
[im 1/62]
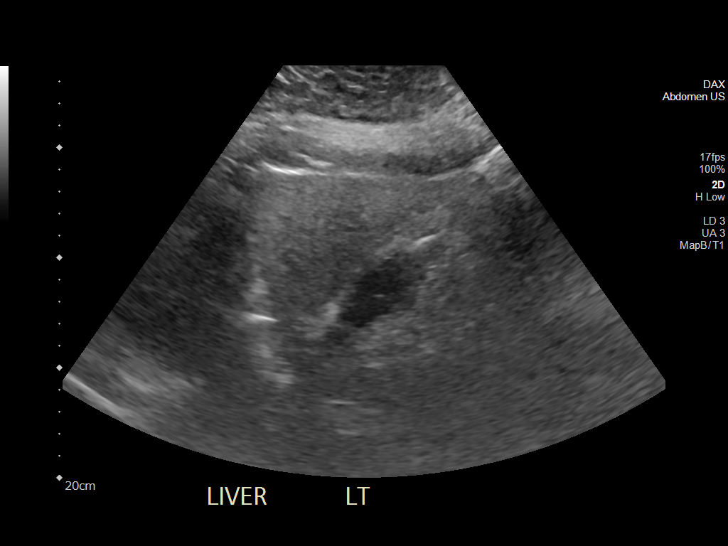
[im 6/62]
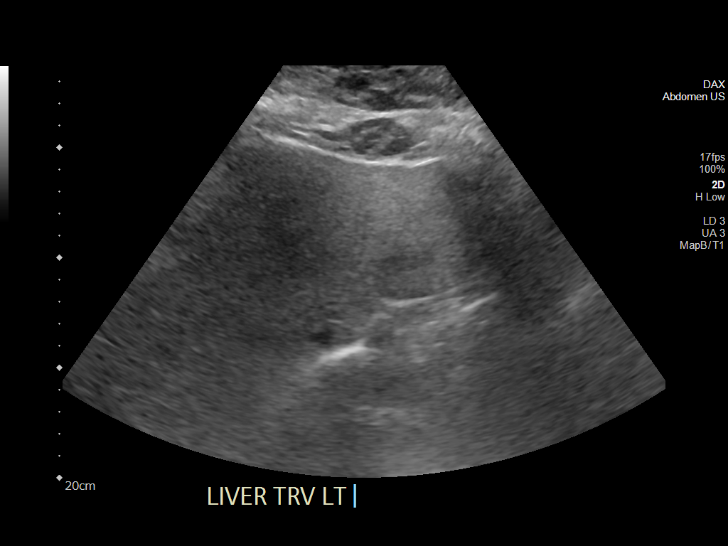
[im 11/62]
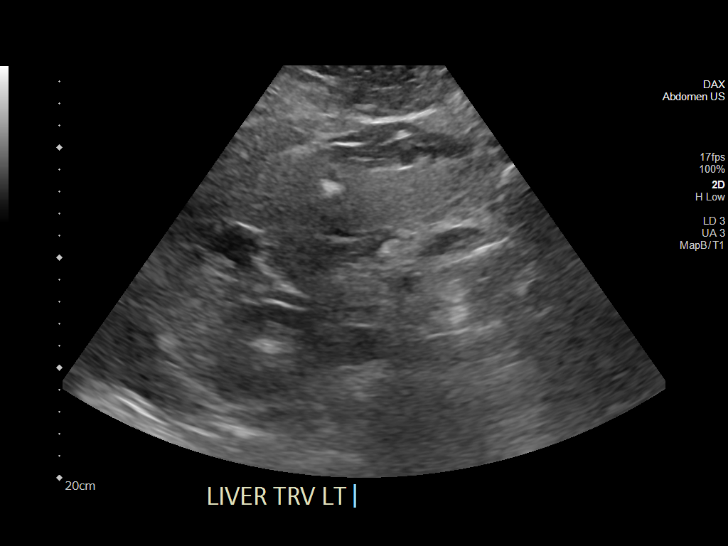
[im 16/62]
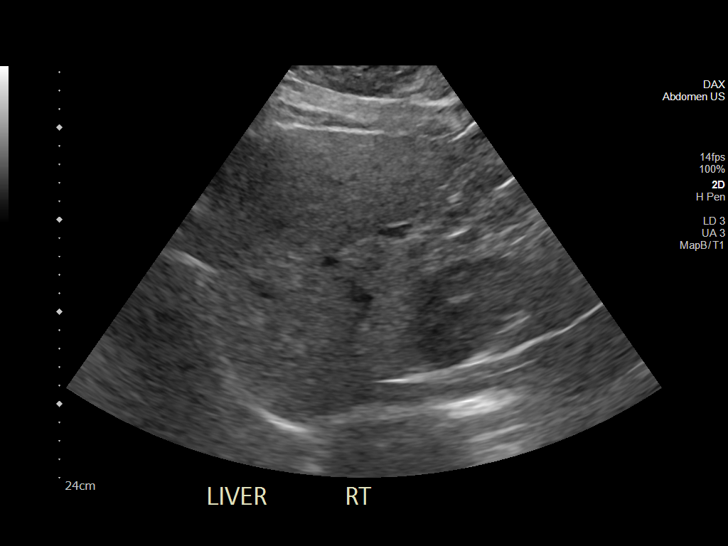
[im 21/62]
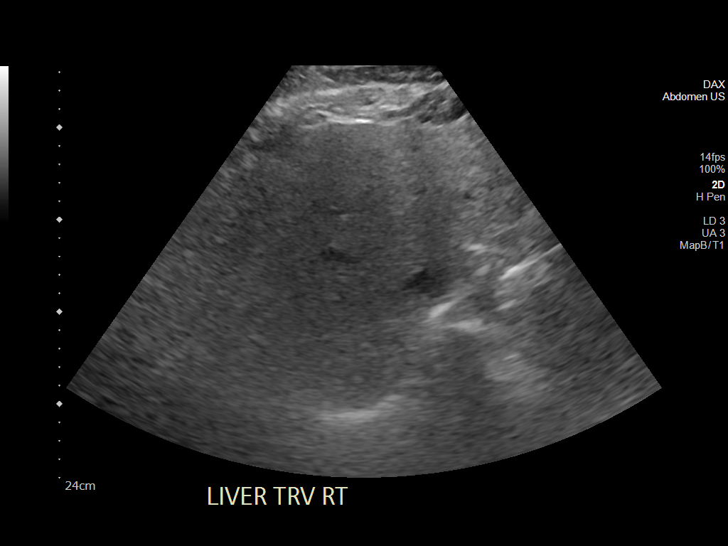
[im 23/62]
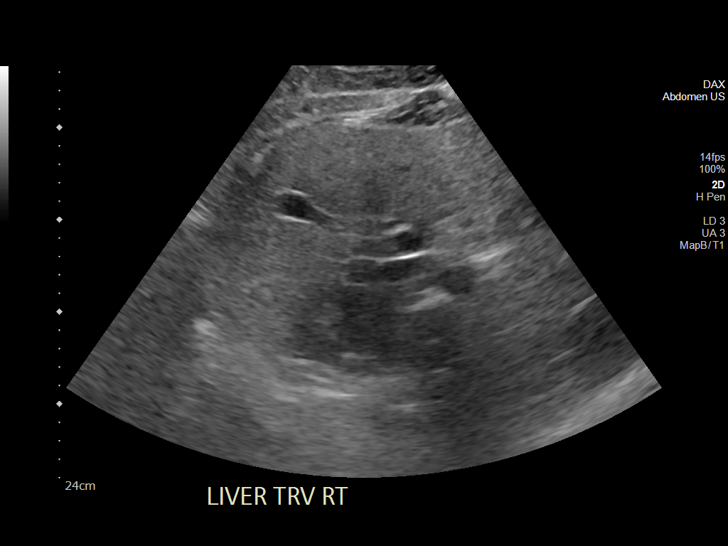
[im 28/62]
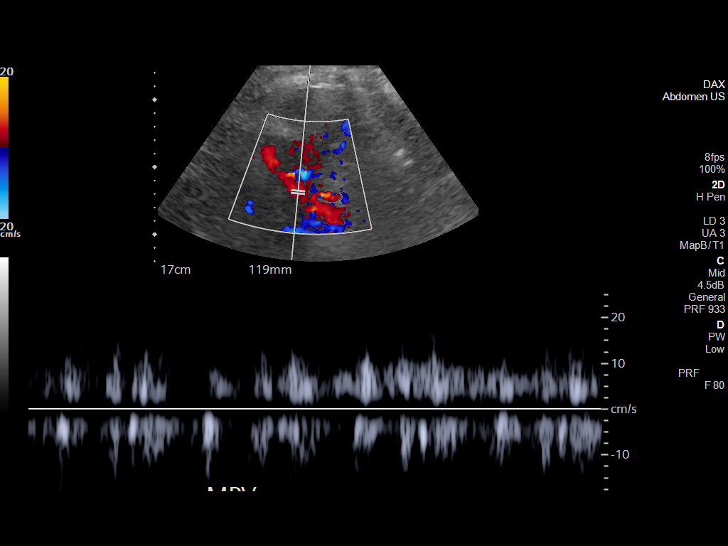
[im 34/62]
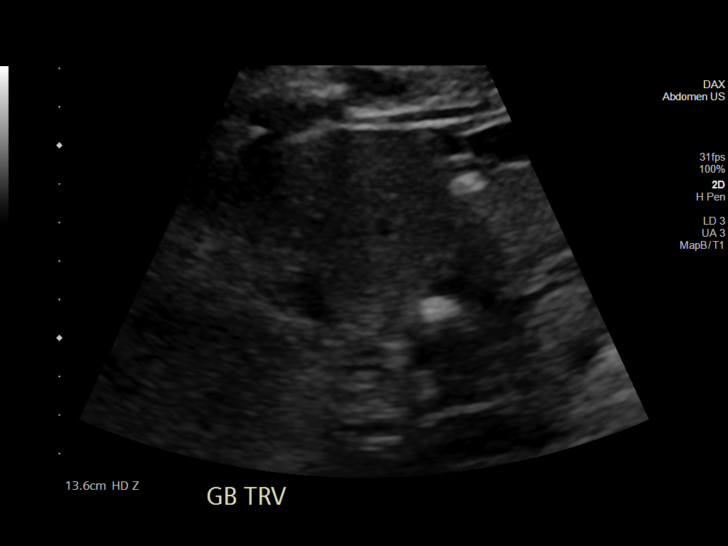
[im 39/62]
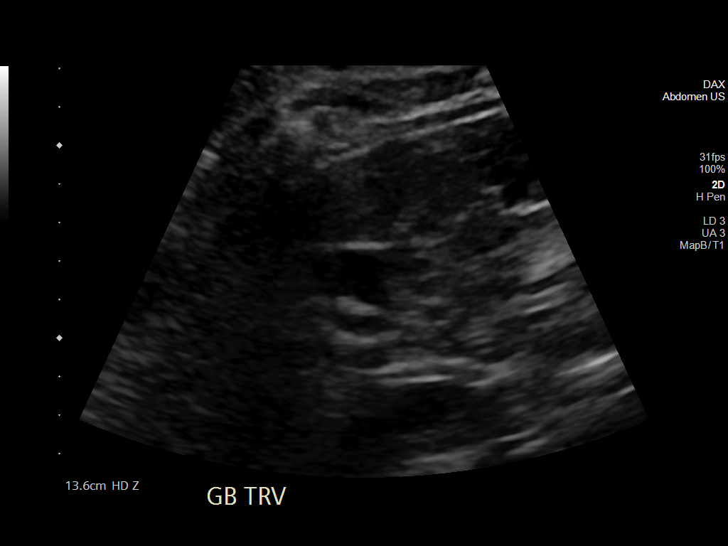
[im 41/62]
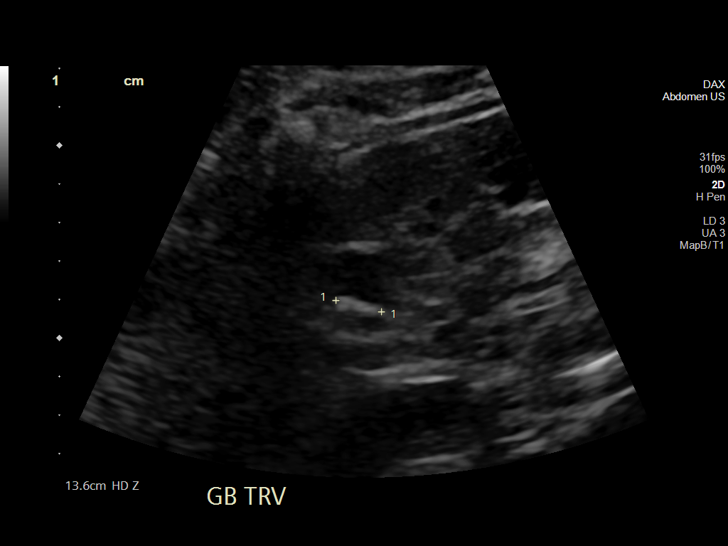
[im 46/62]
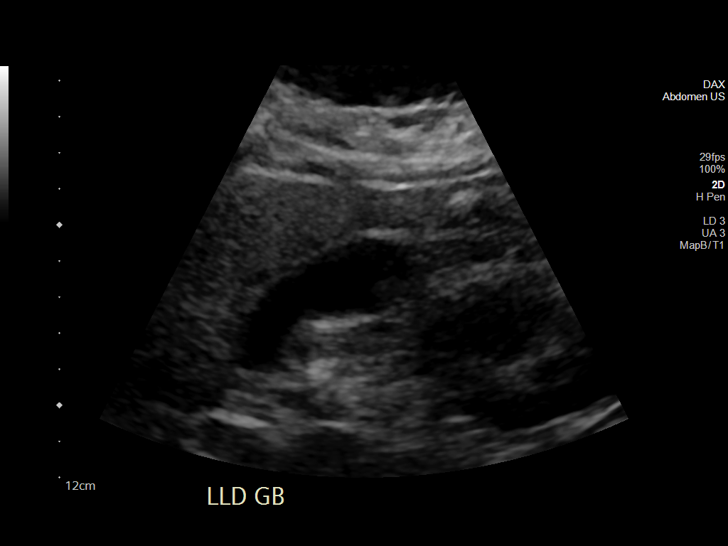
[im 51/62]
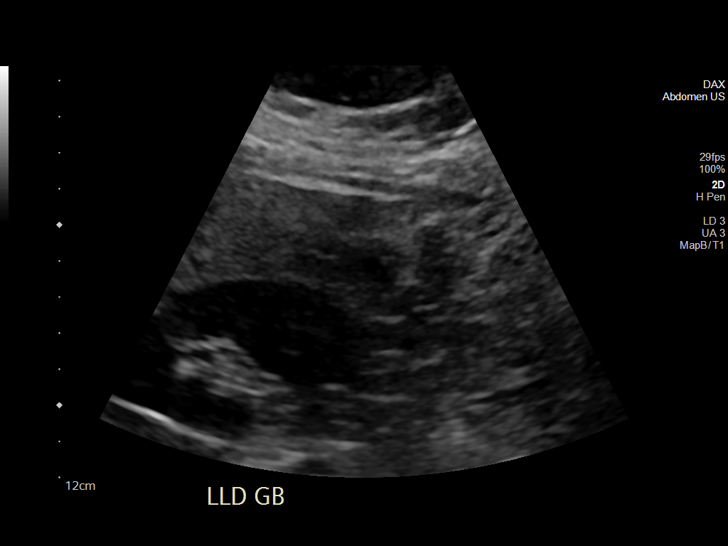
[im 56/62]
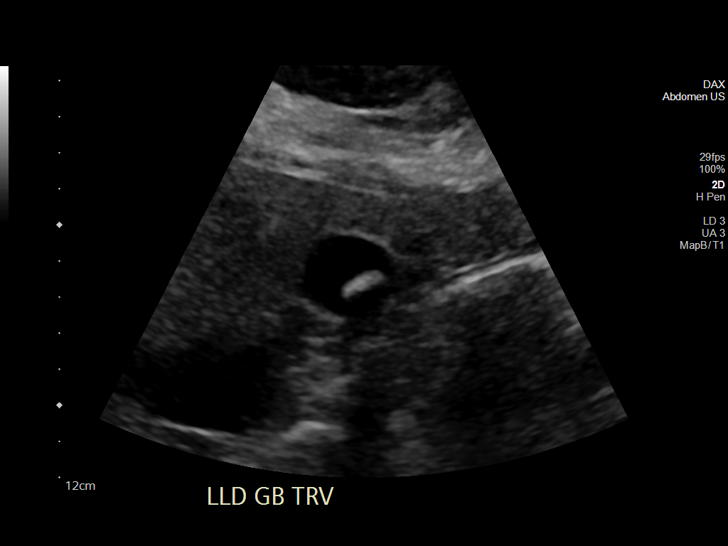
[im 62/62]
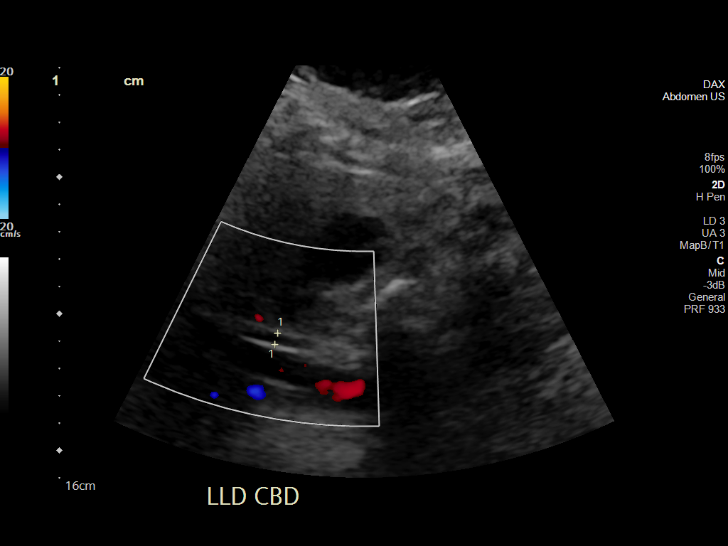

[14 of 25 positions shown; findings below may reference images not displayed]

FINDINGS: Gallbladder:

Shadowing stone within the gallbladder measuring up to 1.2 cm.
Normal wall thickness. Negative sonographic Murphy.

Common bile duct:

Diameter: 4.9 mm

Liver:

Diffusely echogenic. No focal hepatic abnormality. Portal vein is
patent on color Doppler imaging with normal direction of blood flow
towards the liver.

Other: None.
IMPRESSION: 1. Cholelithiasis without sonographic evidence for acute
cholecystitis
2. Echogenic liver consistent with hepatic steatosis

## 2022-09-10 ENCOUNTER — Other Ambulatory Visit: Payer: Self-pay | Admitting: Obstetrics & Gynecology

## 2022-10-04 ENCOUNTER — Encounter (HOSPITAL_BASED_OUTPATIENT_CLINIC_OR_DEPARTMENT_OTHER): Payer: Self-pay | Admitting: Obstetrics & Gynecology

## 2022-10-04 NOTE — Progress Notes (Addendum)
Spoke w/ via phone for pre-op interview--- pt Lab needs dos----  cbc, bmp, t&s, serum preg             Lab results------ no COVID test -----patient states asymptomatic no test needed Arrive at ------- 0530 on 10-07-2022 NPO after MN NO Solid Food.  Clear liquids from MN until--- 0430 Med rec completed Medications to take morning of surgery ----- pepcid Diabetic medication ----- do not take metformin even though you do not take for diabetes Patient instructed no nail polish to be worn day of surgery Patient instructed to bring photo id and insurance card day of surgery Patient aware to have Driver (ride ) / caregiver    for 24 hours after surgery -- husband, myels Patient Special Instructions ----- asked to bring rescue inhaler dos Pre-Op special Istructions ----- n/a Patient verbalized understanding of instructions that were given at this phone interview. Patient denies shortness of breath, chest pain, fever, cough at this phone interview.

## 2022-10-06 NOTE — H&P (Addendum)
Amy Henry is an 42 y.o. female here for salpingectomy due to hydrosalpinx.  She is also having gall bladder removed by Dr Ninfa Linden  H/o Laparotomy for b/l ovarian cystectomy in 2007  PCOS, obesity. Menses are regular. TTC x 1 yr. One functioning tube on HSG. PCOS and PreDM. PCP Dr Ernie Hew. Right hydrosalpinx per records and [facility]I doc recommends removal. But due to change of insurance, she came to our office.    Patient's last menstrual period was 09/23/2022 (exact date).    Past Medical History:  Diagnosis Date   Anemia    Family history of adverse reaction to anesthesia    maternal grandmother---  per pt after appendectomy age 62 found she had dementia   Gallstones    symptomatic   GERD (gastroesophageal reflux disease)    Hydrosalpinx    right side   Mild asthma    PCOS (polycystic ovarian syndrome)    Wears contact lenses     Past Surgical History:  Procedure Laterality Date   EXPLORATORY LAPAROTOMY  2007   bilateral ovarian cystectomy    History reviewed. No pertinent family history.  Social History:  reports that she quit smoking about 13 years ago. Her smoking use included cigarettes. She has never used smokeless tobacco. She reports current alcohol use of about 7.0 standard drinks of alcohol per week. She reports that she does not use drugs.  Allergies: No Known Allergies  No medications prior to admission.   Review of Systems neg   Height '5\' 4"'$  (1.626 m), last menstrual period 09/23/2022. Physical Exam  BMI 47 A&O x 3, no acute distress. Pleasant HEENT neg, no thyromegaly Lungs CTA bilat CV RRR, S1S2 normal Abdo soft, non tender, non acute Extr no edema/ tenderness   Assessment/Plan: Right hydrosalpinx.  Here for laparoscopic right salpingectomy- complete or partial salpingectomy if severe adhesions from prior ovarian cyst open surgery. Goal is to disconnect tubal fluid from entering endometrial cavity even if entire tube is not being to remove. Pt  understands  Risks/complications of surgery reviewed incl infection, bleeding, damage to internal organs including bladder, bowels, ureters, blood vessels, other risks from anesthesia, VTE and delayed complications of any surgery, complications in future surgery reviewed.  My part of surgery is outpatient and we will not need post-op admission    Amy Henry 10/06/2022, 8:10 PM  Addendum H&P reviewed, Pt seen. Plan for Laparoscopic right salpingectomy and lysis of adhesions.  Risks/complications of surgery reviewed incl infection, bleeding, damage to internal organs including bladder, bowels, ureters, blood vessels, other risks from anesthesia, VTE and delayed complications of any surgery, complications in future surgery reviewed. No new changes in above note --Azucena Fallen MD

## 2022-10-06 NOTE — Anesthesia Preprocedure Evaluation (Signed)
Anesthesia Evaluation  Patient identified by MRN, date of birth, ID band Patient awake    Reviewed: Allergy & Precautions, NPO status , Patient's Chart, lab work & pertinent test results  Airway Mallampati: II  TM Distance: >3 FB Neck ROM: Full    Dental no notable dental hx. (+) Dental Advisory Given, Teeth Intact   Pulmonary asthma , former smoker   Pulmonary exam normal breath sounds clear to auscultation       Cardiovascular negative cardio ROS  Rhythm:Regular Rate:Normal     Neuro/Psych negative neurological ROS     GI/Hepatic Neg liver ROS,GERD  ,,  Endo/Other    Morbid obesity  Renal/GU negative Renal ROS     Musculoskeletal negative musculoskeletal ROS (+)    Abdominal  (+) + obese  Peds  Hematology  (+) Blood dyscrasia, anemia   Anesthesia Other Findings   Reproductive/Obstetrics                             Anesthesia Physical Anesthesia Plan  ASA: 3  Anesthesia Plan: General   Post-op Pain Management: Tylenol PO (pre-op)* and Gabapentin PO (pre-op)*   Induction: Intravenous  PONV Risk Score and Plan: 4 or greater and Ondansetron, Dexamethasone, Treatment may vary due to age or medical condition, Midazolam and Scopolamine patch - Pre-op  Airway Management Planned: Oral ETT  Additional Equipment:   Intra-op Plan:   Post-operative Plan: Extubation in OR  Informed Consent: I have reviewed the patients History and Physical, chart, labs and discussed the procedure including the risks, benefits and alternatives for the proposed anesthesia with the patient or authorized representative who has indicated his/her understanding and acceptance.     Dental advisory given  Plan Discussed with: CRNA  Anesthesia Plan Comments:         Anesthesia Quick Evaluation

## 2022-10-06 NOTE — H&P (Signed)
REFERRING PHYSICIAN: Self  PROVIDER: Beverlee Nims, MD  MRN: [id number] DOB: 02/09/1981  Subjective  Chief Complaint: New Consultation (gallstones)   History of Present Illness: Amy Henry is a 42 y.o. female who is seen tas an office consultation for evaluation of New Consultation (gallstones) .  This is a pleasant 42 year old female was been referred here for evaluation of symptomatic gallstones. She has had known gallstones for over a year. Several times a month she has been having epigastric abdominal pain after fatty meals. It is moderate in intensity. She will have bloating and had 1 episode of emesis. She has had ultrasounds confirming gallstones with the largest gallstone measuring 1.2 cm. She does have a history of polycystic ovarian disease and is being referred to GYN  for potential removal of the right tube and ovary. Currently, she has no abdominal pain. She denies any jaundice. Bowel movements are normal. She has no cardiopulmonary issues  Review of Systems: A complete review of systems was obtained from the patient. I have reviewed this information and discussed as appropriate with the patient. See HPI as well for other ROS.  ROS  Medical History: Past Medical History: Diagnosis Date Abnormal uterine bleeding April Amenorrhea June Anemia June Anxiety Asthma, unspecified asthma severity, unspecified whether complicated, unspecified whether persistent Clotting disorder (CMS-HCC) June Dysmenorrhea July Hypertension June Infertility management 2014  Patient Active Problem List Diagnosis Morbid obesity with BMI of 40.0-44.9, adult (CMS-HCC) Menorrhagia with irregular cycle Anemia due to blood loss, chronic Prediabetes PCOS (polycystic ovarian syndrome)  Past Surgical History: Procedure Laterality Date ABDOMINAL SURGERY laparotomy for ovarian cyst removal REMOVAL OVARIAN CYST Bilateral   No Known Allergies  Current Outpatient Medications on  File Prior to Visit Medication Sig Dispense Refill cholecalciferol (VITAMIN D3) 1,250 mcg (50,000 unit) capsule Take 50,000 Units by mouth once a week metFORMIN (GLUCOPHAGE) 1000 MG tablet 1 TABLET ORALLY 2 TIMES PER DAY 10 MIN PRIOR TO MEALS albuterol 90 mcg/actuation inhaler Inhale 2 inhalations into the lungs every 4 (four) hours as needed for Wheezing or Shortness of Breath 1 Inhaler 2 ibuprofen (MOTRIN) 600 MG tablet Take 1 tablet (600 mg total) by mouth every 6 (six) hours as needed for Pain Take one tablet every 6 hours if needed for pain (Patient not taking: Reported on 07/16/2022) 14 tablet 0 medroxyPROGESTERone (PROVERA) 10 MG tablet Take 1 tablet (10 mg total) by mouth once daily Take one a day for 10 days (Patient not taking: Reported on 07/16/2022) 10 tablet 2 medroxyPROGESTERone (PROVERA) 10 MG tablet Take 1 tablet (10 mg total) by mouth once daily Take one a day for 10 days (Patient not taking: Reported on 07/16/2022) 10 tablet 2 medroxyPROGESTERone (PROVERA) 10 MG tablet TAKE 1 TABLET (10 MG TOTAL) BY MOUTH ONCE DAILY TAKE ONE A DAY FOR 10 DAYS (Patient not taking: Reported on 07/16/2022) 10 tablet 3 metFORMIN (GLUCOPHAGE) 500 MG tablet Take 1 tablet (500 mg total) by mouth 3 (three) times daily before meals 90 tablet 11 spironolactone (ALDACTONE) 50 MG tablet Take 1 tablet (50 mg total) by mouth once daily 30 tablet 3 TRI-LO-MARZIA 0.18/0.215/0.25 mg-25 mcg tablet TAKE 1 TABLET BY MOUTH EVERY DAY (Patient not taking: Reported on 07/16/2022) 84 tablet 0  No current facility-administered medications on file prior to visit.  Family History Problem Relation Age of Onset No Known Problems Mother No Known Problems Father No Known Problems Son Alzheimer's disease Maternal Grandmother   Social History  Tobacco Use Smoking Status Former Types: Cigarettes Quit date:  2002 Years since quitting: 21.9 Smokeless Tobacco Never   Social History  Socioeconomic History Marital status:  Single Tobacco Use Smoking status: Former Types: Cigarettes Quit date: 2002 Years since quitting: 21.9 Smokeless tobacco: Never Vaping Use Vaping Use: Never used Substance and Sexual Activity Alcohol use: Yes Alcohol/week: 1.0 standard drink of alcohol Types: 1 Glasses of wine per week Drug use: Never Sexual activity: Yes Partners: Male Birth control/protection: OCP  Objective:  Vitals:  BP: 126/82 Pulse: 105 Temp: 36.7 C (98.1 F) SpO2: 98% Weight: (!) 122.1 kg (269 lb 3.2 oz) Height: 163.8 cm (5' 4.5")  Body mass index is 45.49 kg/m.  Physical Exam  She appears well on exam  Her abdomen is soft  There is no hepatomegaly. There is no right upper quadrant tenderness or guarding and there are no masses  Labs, Imaging and Diagnostic Testing: I reviewed the notes from her primary care provider and have evaluated her previous ultrasound and CT scan  Assessment and Plan:  Diagnoses and all orders for this visit:  Symptomatic cholelithiasis    We next had a discussion regarding symptomatic gallstones and the reasons to proceed with surgery. She is interested in having her gallbladder removed given the frequency of her symptoms. I gave her literature regarding this. She is also interested in potentially coordinating this with GUN when she sees her for potential removal of an ovary and fallopian tube. I discussed my portion of her procedure. I explained the procedure in detail and we discussed the risks. These risks include but are not limited to bleeding, infection, injury to surrounding structures, bile duct injury, bile leak, the need to convert to an open procedure, cardiopulmonary issues, DVT, postoperative recovery, etc.  Given, we will try to coordinate surgery once she has had her referral to GYN.

## 2022-10-07 ENCOUNTER — Encounter (HOSPITAL_BASED_OUTPATIENT_CLINIC_OR_DEPARTMENT_OTHER): Payer: Self-pay | Admitting: Obstetrics & Gynecology

## 2022-10-07 ENCOUNTER — Encounter (HOSPITAL_BASED_OUTPATIENT_CLINIC_OR_DEPARTMENT_OTHER)
Admission: RE | Disposition: A | Discharge status: Home / Self Care | Payer: Self-pay | Source: Ambulatory Visit | Attending: Obstetrics & Gynecology

## 2022-10-07 ENCOUNTER — Other Ambulatory Visit: Payer: Self-pay

## 2022-10-07 ENCOUNTER — Ambulatory Visit (HOSPITAL_BASED_OUTPATIENT_CLINIC_OR_DEPARTMENT_OTHER)
Admission: RE | Admit: 2022-10-07 | Discharge: 2022-10-07 | Disposition: A | Discharge status: Home / Self Care | Payer: 59 | Source: Ambulatory Visit | Attending: Obstetrics & Gynecology | Admitting: Obstetrics & Gynecology

## 2022-10-07 ENCOUNTER — Ambulatory Visit (HOSPITAL_BASED_OUTPATIENT_CLINIC_OR_DEPARTMENT_OTHER): Payer: 59 | Admitting: Anesthesiology

## 2022-10-07 DIAGNOSIS — J45909 Unspecified asthma, uncomplicated: Secondary | ICD-10-CM

## 2022-10-07 DIAGNOSIS — K660 Peritoneal adhesions (postprocedural) (postinfection): Secondary | ICD-10-CM | POA: Diagnosis not present

## 2022-10-07 DIAGNOSIS — Z09 Encounter for follow-up examination after completed treatment for conditions other than malignant neoplasm: Secondary | ICD-10-CM | POA: Diagnosis not present

## 2022-10-07 DIAGNOSIS — Z87891 Personal history of nicotine dependence: Secondary | ICD-10-CM

## 2022-10-07 DIAGNOSIS — K219 Gastro-esophageal reflux disease without esophagitis: Secondary | ICD-10-CM | POA: Insufficient documentation

## 2022-10-07 DIAGNOSIS — E282 Polycystic ovarian syndrome: Secondary | ICD-10-CM | POA: Diagnosis not present

## 2022-10-07 DIAGNOSIS — K8070 Calculus of gallbladder and bile duct without cholecystitis without obstruction: Secondary | ICD-10-CM | POA: Diagnosis present

## 2022-10-07 DIAGNOSIS — R7303 Prediabetes: Secondary | ICD-10-CM | POA: Diagnosis not present

## 2022-10-07 DIAGNOSIS — Z6841 Body Mass Index (BMI) 40.0 and over, adult: Secondary | ICD-10-CM | POA: Insufficient documentation

## 2022-10-07 DIAGNOSIS — Z01818 Encounter for other preprocedural examination: Secondary | ICD-10-CM

## 2022-10-07 DIAGNOSIS — K8020 Calculus of gallbladder without cholecystitis without obstruction: Secondary | ICD-10-CM

## 2022-10-07 DIAGNOSIS — N7011 Chronic salpingitis: Secondary | ICD-10-CM | POA: Diagnosis not present

## 2022-10-07 DIAGNOSIS — N736 Female pelvic peritoneal adhesions (postinfective): Secondary | ICD-10-CM

## 2022-10-07 DIAGNOSIS — D649 Anemia, unspecified: Secondary | ICD-10-CM

## 2022-10-07 HISTORY — DX: Calculus of gallbladder without cholecystitis without obstruction: K80.20

## 2022-10-07 HISTORY — DX: Presence of spectacles and contact lenses: Z97.3

## 2022-10-07 HISTORY — DX: Unspecified asthma, uncomplicated: J45.909

## 2022-10-07 HISTORY — DX: Chronic salpingitis: N70.11

## 2022-10-07 HISTORY — PX: CHOLECYSTECTOMY: SHX55

## 2022-10-07 HISTORY — DX: Anemia, unspecified: D64.9

## 2022-10-07 HISTORY — PX: LAPAROSCOPIC LYSIS OF ADHESIONS: SHX5905

## 2022-10-07 HISTORY — DX: Gastro-esophageal reflux disease without esophagitis: K21.9

## 2022-10-07 HISTORY — DX: Family history of other specified conditions: Z84.89

## 2022-10-07 LAB — CBC
HCT: 40 % (ref 36.0–46.0)
Hemoglobin: 12.9 g/dL (ref 12.0–15.0)
MCH: 28.2 pg (ref 26.0–34.0)
MCHC: 32.3 g/dL (ref 30.0–36.0)
MCV: 87.3 fL (ref 80.0–100.0)
Platelets: 299 10*3/uL (ref 150–400)
RBC: 4.58 MIL/uL (ref 3.87–5.11)
RDW: 13.1 % (ref 11.5–15.5)
WBC: 5.7 10*3/uL (ref 4.0–10.5)
nRBC: 0 % (ref 0.0–0.2)

## 2022-10-07 LAB — HCG, SERUM, QUALITATIVE: Preg, Serum: NEGATIVE

## 2022-10-07 LAB — TYPE AND SCREEN
ABO/RH(D): O POS
Antibody Screen: NEGATIVE

## 2022-10-07 LAB — ABO/RH: ABO/RH(D): O POS

## 2022-10-07 SURGERY — LYSIS, ADHESIONS, LAPAROSCOPIC
Anesthesia: General | Site: Abdomen | Laterality: Right

## 2022-10-07 MED ORDER — POVIDONE-IODINE 10 % EX SWAB: 2.0000 | Freq: Once | CUTANEOUS | Status: DC

## 2022-10-07 MED ORDER — DEXAMETHASONE SODIUM PHOSPHATE 4 MG/ML IJ SOLN
INTRAMUSCULAR | Status: DC | PRN
  Administered 2022-10-07: 10 mg via INTRAVENOUS

## 2022-10-07 MED ORDER — CHLORHEXIDINE GLUCONATE CLOTH 2 % EX PADS: 6.0000 | MEDICATED_PAD | Freq: Once | CUTANEOUS | Status: DC

## 2022-10-07 MED ORDER — OXYCODONE HCL 5 MG/5ML PO SOLN: 5.0000 mg | Freq: Once | ORAL | Status: DC | PRN

## 2022-10-07 MED ORDER — ROCURONIUM BROMIDE 100 MG/10ML IV SOLN
INTRAVENOUS | Status: DC | PRN
  Administered 2022-10-07: 10 mg via INTRAVENOUS
  Administered 2022-10-07: 5 mg via INTRAVENOUS
  Administered 2022-10-07: 70 mg via INTRAVENOUS

## 2022-10-07 MED ORDER — ACETAMINOPHEN 500 MG PO TABS
1000.0000 mg | ORAL_TABLET | ORAL | Status: AC
  Administered 2022-10-07: 1000 mg via ORAL

## 2022-10-07 MED ORDER — GABAPENTIN 300 MG PO CAPS
300.0000 mg | ORAL_CAPSULE | Freq: Once | ORAL | Status: AC
  Administered 2022-10-07: 300 mg via ORAL

## 2022-10-07 MED ORDER — MIDAZOLAM HCL 2 MG/2ML IJ SOLN
INTRAMUSCULAR | Status: DC | PRN
  Administered 2022-10-07: 2 mg via INTRAVENOUS

## 2022-10-07 MED ORDER — CEFAZOLIN SODIUM-DEXTROSE 2-4 GM/100ML-% IV SOLN
INTRAVENOUS | Status: AC
  Filled 2022-10-07: qty 100

## 2022-10-07 MED ORDER — MEPERIDINE HCL 25 MG/ML IJ SOLN: 6.2500 mg | INTRAMUSCULAR | Status: DC | PRN

## 2022-10-07 MED ORDER — 0.9 % SODIUM CHLORIDE (POUR BTL) OPTIME
TOPICAL | Status: DC | PRN
  Administered 2022-10-07: 500 mL

## 2022-10-07 MED ORDER — AMISULPRIDE (ANTIEMETIC) 5 MG/2ML IV SOLN: 10.0000 mg | Freq: Once | INTRAVENOUS | Status: DC | PRN

## 2022-10-07 MED ORDER — CEFAZOLIN SODIUM-DEXTROSE 2-4 GM/100ML-% IV SOLN: 2.0000 g | INTRAVENOUS | Status: DC

## 2022-10-07 MED ORDER — ONDANSETRON HCL 4 MG/2ML IJ SOLN
INTRAMUSCULAR | Status: AC
  Filled 2022-10-07: qty 2

## 2022-10-07 MED ORDER — FENTANYL CITRATE (PF) 100 MCG/2ML IJ SOLN
INTRAMUSCULAR | Status: AC
  Filled 2022-10-07: qty 2

## 2022-10-07 MED ORDER — PROMETHAZINE HCL 25 MG/ML IJ SOLN: 6.2500 mg | INTRAMUSCULAR | Status: DC | PRN

## 2022-10-07 MED ORDER — ACETAMINOPHEN 500 MG PO TABS
ORAL_TABLET | ORAL | Status: AC
  Filled 2022-10-07: qty 2

## 2022-10-07 MED ORDER — HYDROMORPHONE HCL 1 MG/ML IJ SOLN
INTRAMUSCULAR | Status: AC
  Filled 2022-10-07: qty 1

## 2022-10-07 MED ORDER — OXYCODONE HCL 5 MG PO TABS: 5.0000 mg | ORAL_TABLET | Freq: Once | ORAL | Status: DC | PRN

## 2022-10-07 MED ORDER — GABAPENTIN 300 MG PO CAPS
ORAL_CAPSULE | ORAL | Status: AC
  Filled 2022-10-07: qty 1

## 2022-10-07 MED ORDER — ONDANSETRON HCL 4 MG/2ML IJ SOLN
INTRAMUSCULAR | Status: DC | PRN
  Administered 2022-10-07: 4 mg via INTRAVENOUS

## 2022-10-07 MED ORDER — PROPOFOL 10 MG/ML IV BOLUS
INTRAVENOUS | Status: AC
  Filled 2022-10-07: qty 20

## 2022-10-07 MED ORDER — ROCURONIUM BROMIDE 10 MG/ML (PF) SYRINGE
PREFILLED_SYRINGE | INTRAVENOUS | Status: AC
  Filled 2022-10-07: qty 10

## 2022-10-07 MED ORDER — BUPIVACAINE HCL (PF) 0.25 % IJ SOLN
INTRAMUSCULAR | Status: DC | PRN
  Administered 2022-10-07: 30 mL

## 2022-10-07 MED ORDER — KETOROLAC TROMETHAMINE 30 MG/ML IJ SOLN
INTRAMUSCULAR | Status: DC | PRN
  Administered 2022-10-07: 30 mg via INTRAVENOUS

## 2022-10-07 MED ORDER — SUGAMMADEX SODIUM 200 MG/2ML IV SOLN
INTRAVENOUS | Status: DC | PRN
  Administered 2022-10-07: 200 mg via INTRAVENOUS

## 2022-10-07 MED ORDER — FENTANYL CITRATE (PF) 100 MCG/2ML IJ SOLN
INTRAMUSCULAR | Status: DC | PRN
  Administered 2022-10-07 (×2): 50 ug via INTRAVENOUS
  Administered 2022-10-07: 100 ug via INTRAVENOUS

## 2022-10-07 MED ORDER — LIDOCAINE HCL (CARDIAC) PF 100 MG/5ML IV SOSY
PREFILLED_SYRINGE | INTRAVENOUS | Status: DC | PRN
  Administered 2022-10-07: 100 mg via INTRAVENOUS

## 2022-10-07 MED ORDER — SODIUM CHLORIDE 0.9 % IR SOLN
Status: DC | PRN
  Administered 2022-10-07: 1000 mL

## 2022-10-07 MED ORDER — HYDROMORPHONE HCL 1 MG/ML IJ SOLN
0.2500 mg | INTRAMUSCULAR | Status: DC | PRN
  Administered 2022-10-07 (×3): 0.25 mg via INTRAVENOUS

## 2022-10-07 MED ORDER — TRAMADOL HCL 50 MG PO TABS: 50.0000 mg | ORAL_TABLET | Freq: Four times a day (QID) | ORAL | 0 refills | Status: AC | PRN

## 2022-10-07 MED ORDER — LACTATED RINGERS IV SOLN: INTRAVENOUS | Status: DC

## 2022-10-07 MED ORDER — METHYLENE BLUE 1 % INJ SOLN
INTRAVENOUS | Status: DC | PRN
  Administered 2022-10-07: 10 mL

## 2022-10-07 MED ORDER — PROPOFOL 10 MG/ML IV BOLUS
INTRAVENOUS | Status: DC | PRN
  Administered 2022-10-07: 40 mg via INTRAVENOUS
  Administered 2022-10-07: 200 mg via INTRAVENOUS
  Administered 2022-10-07: 30 mg via INTRAVENOUS

## 2022-10-07 MED ORDER — ENSURE PRE-SURGERY PO LIQD: 296.0000 mL | Freq: Once | ORAL | Status: DC

## 2022-10-07 MED ORDER — MIDAZOLAM HCL 2 MG/2ML IJ SOLN
INTRAMUSCULAR | Status: AC
  Filled 2022-10-07: qty 2

## 2022-10-07 MED ORDER — DEXAMETHASONE SODIUM PHOSPHATE 10 MG/ML IJ SOLN
INTRAMUSCULAR | Status: AC
  Filled 2022-10-07: qty 1

## 2022-10-07 MED ORDER — ONDANSETRON HCL 4 MG PO TABS: 4.0000 mg | ORAL_TABLET | Freq: Three times a day (TID) | ORAL | 0 refills | Status: AC | PRN

## 2022-10-07 MED ORDER — LIDOCAINE HCL (PF) 2 % IJ SOLN
INTRAMUSCULAR | Status: AC
  Filled 2022-10-07: qty 5

## 2022-10-07 MED ORDER — CEFAZOLIN SODIUM-DEXTROSE 2-4 GM/100ML-% IV SOLN
2.0000 g | INTRAVENOUS | Status: AC
  Administered 2022-10-07: 2 g via INTRAVENOUS

## 2022-10-07 SURGICAL SUPPLY — 68 items
ADH SKN CLS APL DERMABOND .7 (GAUZE/BANDAGES/DRESSINGS) ×2
APL PRP STRL LF DISP 70% ISPRP (MISCELLANEOUS) ×2
APL SRG 38 LTWT LNG FL B (MISCELLANEOUS)
APL SWBSTK 6 STRL LF DISP (MISCELLANEOUS) ×2
APPLICATOR ARISTA FLEXITIP XL (MISCELLANEOUS) IMPLANT
APPLICATOR COTTON TIP 6 STRL (MISCELLANEOUS) IMPLANT
APPLICATOR COTTON TIP 6IN STRL (MISCELLANEOUS) ×2 IMPLANT
APPLIER CLIP 5 13 M/L LIGAMAX5 (MISCELLANEOUS) ×2
APR CLP MED LRG 5 ANG JAW (MISCELLANEOUS) ×2
BAG SPEC RTRVL LRG 6X4 10 (ENDOMECHANICALS) ×2
BLADE CLIPPER SENSICLIP SURGIC (BLADE) IMPLANT
CABLE HIGH FREQUENCY MONO STRZ (ELECTRODE) ×2 IMPLANT
CHLORAPREP W/TINT 26 (MISCELLANEOUS) ×2 IMPLANT
CLIP APPLIE 5 13 M/L LIGAMAX5 (MISCELLANEOUS) ×2 IMPLANT
COVER MAYO STAND STRL (DRAPES) ×2 IMPLANT
DERMABOND ADVANCED .7 DNX12 (GAUZE/BANDAGES/DRESSINGS) ×4 IMPLANT
DISSECTOR BLUNT TIP ENDO 5MM (MISCELLANEOUS) IMPLANT
DRAPE C-ARM 42X120 X-RAY (DRAPES) ×2 IMPLANT
DRAPE SURG IRRIG POUCH 19X23 (DRAPES) ×2 IMPLANT
DRSG OPSITE POSTOP 3X4 (GAUZE/BANDAGES/DRESSINGS) IMPLANT
DURAPREP 26ML APPLICATOR (WOUND CARE) ×2 IMPLANT
ELECT REM PT RETURN 9FT ADLT (ELECTROSURGICAL) ×4
ELECTRODE REM PT RTRN 9FT ADLT (ELECTROSURGICAL) ×4 IMPLANT
GAUZE 4X4 16PLY ~~LOC~~+RFID DBL (SPONGE) ×6 IMPLANT
GLOVE BIO SURGEON STRL SZ7 (GLOVE) ×2 IMPLANT
GLOVE BIOGEL PI IND STRL 7.0 (GLOVE) ×4 IMPLANT
GLOVE BIOGEL PI IND STRL 7.5 (GLOVE) IMPLANT
GLOVE SURG PR MICRO ENCORE 7.5 (GLOVE) ×2 IMPLANT
GLOVE SURG SS PI 6.5 STRL IVOR (GLOVE) IMPLANT
GOWN STRL REUS W/TWL LRG LVL3 (GOWN DISPOSABLE) ×6 IMPLANT
GOWN STRL REUS W/TWL XL LVL3 (GOWN DISPOSABLE) ×2 IMPLANT
HEMOSTAT ARISTA ABSORB 3G PWDR (HEMOSTASIS) IMPLANT
HEMOSTAT SURGICEL 2X4 FIBR (HEMOSTASIS) IMPLANT
KIT PINK PAD W/HEAD ARE REST (MISCELLANEOUS) ×2
KIT PINK PAD W/HEAD ARM REST (MISCELLANEOUS) ×2 IMPLANT
KIT TURNOVER CYSTO (KITS) ×4 IMPLANT
LIGASURE VESSEL 5MM BLUNT TIP (ELECTROSURGICAL) IMPLANT
MANIPULATOR UTERINE 4.5 ZUMI (MISCELLANEOUS) IMPLANT
NDL HYPO 18GX1.5 BLUNT FILL (NEEDLE) IMPLANT
NEEDLE HYPO 18GX1.5 BLUNT FILL (NEEDLE) ×2 IMPLANT
NS IRRIG 1000ML POUR BTL (IV SOLUTION) ×2 IMPLANT
PACK BASIN DAY SURGERY FS (CUSTOM PROCEDURE TRAY) ×2 IMPLANT
PACK LAPAROSCOPY BASIN (CUSTOM PROCEDURE TRAY) ×2 IMPLANT
POUCH SPECIMEN RETRIEVAL 10MM (ENDOMECHANICALS) IMPLANT
PROTECTOR NERVE ULNAR (MISCELLANEOUS) ×4 IMPLANT
SCISSORS LAP 5X35 DISP (ENDOMECHANICALS) ×2 IMPLANT
SET SUCTION IRRIG HYDROSURG (IRRIGATION / IRRIGATOR) ×2 IMPLANT
SET TUBE SMOKE EVAC HIGH FLOW (TUBING) ×4 IMPLANT
SLEEVE SCD COMPRESS KNEE MED (STOCKING) ×4 IMPLANT
SLEEVE Z-THREAD 5X100MM (TROCAR) IMPLANT
SOL ELECTROSURG ANTI STICK (MISCELLANEOUS)
SOLUTION ELECTROSURG ANTI STCK (MISCELLANEOUS) IMPLANT
SPONGE T-LAP 18X18 ~~LOC~~+RFID (SPONGE) IMPLANT
SUT MNCRL AB 4-0 PS2 18 (SUTURE) IMPLANT
SUT MON AB 4-0 PC3 18 (SUTURE) ×2 IMPLANT
SUT VIC AB 4-0 PS2 18 (SUTURE) ×2 IMPLANT
SUT VICRYL 0 UR6 27IN ABS (SUTURE) ×2 IMPLANT
SYR 10ML LL (SYRINGE) IMPLANT
SYR 5ML LL (SYRINGE) IMPLANT
SYS BAG RETRIEVAL 10MM (BASKET)
SYSTEM BAG RETRIEVAL 10MM (BASKET) IMPLANT
TOWEL OR 17X24 6PK STRL BLUE (TOWEL DISPOSABLE) ×4 IMPLANT
TRAY FOLEY W/BAG SLVR 14FR LF (SET/KITS/TRAYS/PACK) ×2 IMPLANT
TRAY LAPAROSCOPIC (CUSTOM PROCEDURE TRAY) ×2 IMPLANT
TROCAR BALLN 12MMX100 BLUNT (TROCAR) ×2 IMPLANT
TROCAR Z-THREAD FIOS 5X100MM (TROCAR) IMPLANT
WARMER LAPAROSCOPE (MISCELLANEOUS) ×2 IMPLANT
WATER STERILE IRR 500ML POUR (IV SOLUTION) IMPLANT

## 2022-10-07 NOTE — Transfer of Care (Signed)
Immediate Anesthesia Transfer of Care Note  Patient: Amy Henry  Procedure(s) Performed: LAPAROSCOPIC LYSIS OF ADHESIONS with Laparoscopic Right Salpingectomy (Right: Abdomen) LAPAROSCOPIC CHOLECYSTECTOMY (Abdomen)  Patient Location: PACU  Anesthesia Type:General  Level of Consciousness: awake, alert  and patient cooperative  Airway & Oxygen Therapy: Patient Spontanous Breathing and Patient connected to nasal cannula oxygen  Post-op Assessment: Report given to RN and Post -op Vital signs reviewed and stable  Post vital signs: Reviewed and stable  Last Vitals:  Vitals Value Taken Time  BP 129/73 10/07/22 0945  Temp    Pulse 77 10/07/22 0945  Resp 14 10/07/22 0945  SpO2 100 % 10/07/22 0945  Vitals shown include unvalidated device data.  Last Pain:  Vitals:   10/07/22 0641  TempSrc: Oral  PainSc: 0-No pain      Patients Stated Pain Goal: 5 ([id number])  Complications: No notable events documented.

## 2022-10-07 NOTE — Anesthesia Procedure Notes (Addendum)
Procedure Name: Intubation Date/Time: 10/07/2022 7:57 AM  Performed by: Georgeanne Nim, CRNAPre-anesthesia Checklist: Patient identified, Emergency Drugs available, Suction available, Patient being monitored and Timeout performed Patient Re-evaluated:Patient Re-evaluated prior to induction Oxygen Delivery Method: Circle system utilized Preoxygenation: Pre-oxygenation with 100% oxygen Induction Type: IV induction Ventilation: Mask ventilation without difficulty Laryngoscope Size: Mac and 4 Grade View: Grade I Tube type: Oral Tube size: 7.0 mm Number of attempts: 1 Airway Equipment and Method: Stylet Placement Confirmation: ETT inserted through vocal cords under direct vision, breath sounds checked- equal and bilateral and positive ETCO2 Secured at: 21 cm Tube secured with: Tape Dental Injury: Teeth and Oropharynx as per pre-operative assessment  Comments: By Ivin Booty

## 2022-10-07 NOTE — Discharge Instructions (Addendum)
[facility] ______[facility] LAPAROSCOPIC SURGERY: POST OP INSTRUCTIONS Always review your discharge instruction sheet given to you by the facility where your surgery was performed. IF YOU HAVE DISABILITY OR FAMILY LEAVE FORMS, YOU MUST BRING THEM TO THE OFFICE FOR PROCESSING.   DO NOT GIVE THEM TO YOUR DOCTOR.  A prescription for pain medication may be given to you upon discharge.  Take your pain medication as prescribed, if needed.  If narcotic pain medicine is not needed, then you may take acetaminophen (Tylenol) or ibuprofen (Advil) as needed. Take your usually prescribed medications unless otherwise directed. If you need a refill on your pain medication, please contact your pharmacy.  They will contact our office to request authorization. Prescriptions will not be filled after 5pm or on week-ends. You should follow a light diet the first few days after arrival home, such as soup and crackers, etc.  Be sure to include lots of fluids daily. Most patients will experience some swelling and bruising in the area of the incisions.  Ice packs will help.  Swelling and bruising can take several days to resolve.  It is common to experience some constipation if taking pain medication after surgery.  Increasing fluid intake and taking a stool softener (such as Colace) will usually help or prevent this problem from occurring.  A mild laxative (Milk of Magnesia or Miralax) should be taken according to package instructions if there are no bowel movements after 48 hours. Unless discharge instructions indicate otherwise, you may remove your bandages 24-48 hours after surgery, and you may shower at that time.  You may have steri-strips (small skin tapes) in place directly over the incision.  These strips should be left on the skin for 7-10 days.  If your surgeon used skin glue on the incision, you may shower in 24 hours.  The glue will flake off over the next 2-3 weeks.  Any sutures or staples will be  removed at the office during your follow-up visit. ACTIVITIES:  You may resume regular (light) daily activities beginning the next day--such as daily self-care, walking, climbing stairs--gradually increasing activities as tolerated.  You may have sexual intercourse when it is comfortable.  Refrain from any heavy lifting or straining until approved by your doctor. You may drive when you are no longer taking prescription pain medication, you can comfortably wear a seatbelt, and you can safely maneuver your car and apply brakes. RETURN TO WORK:  __________________________________________________________ Dennis Bast should see your doctor in the office for a follow-up appointment approximately 2-3 weeks after your surgery.  Make sure that you call for this appointment within a day or two after you arrive home to insure a convenient appointment time. OTHER INSTRUCTIONS: YOU MAY SHOWER STARTING TOMORROW ICE PACK, TYLENOL, AND IBUPROFEN ALSO FOR PAIN NO LIFTING MORE THAN 15 POUNDS FOR 2 WEEKS __________________________________________________________________________________________________________________________ __________________________________________________________________________________________________________________________ WHEN TO CALL YOUR DOCTOR: Fever over 101.0 Inability to urinate Continued bleeding from incision. Increased pain, redness, or drainage from the incision. Increasing abdominal pain  The clinic staff is available to answer your questions during regular business hours.  Please don't hesitate to call and ask to speak to one of the nurses for clinical concerns.  If you have a medical emergency, go to the nearest emergency room or call 911.  A surgeon from [facility] is always on call at the hospital. [address], [place], [place], [place]  [address] ? [address], [place], [place]   [address] [phone] ? [phone] ?  FAX [phone] Web site:  [email or web address]     No acetaminophen/Tylenol until after 12:45pm today if needed for pain.   No ibuprofen, Advil, Aleve, Motrin, ketorolac, meloxicam, naproxen, or other NSAIDS until after 3:30pm today if needed for pain.      DISCHARGE INSTRUCTIONS: Laparoscopy  The following instructions have been prepared to help you care for yourself upon your return home today.  Wound care:  Do not get the incision wet for the first 24 hours. The incision should be kept clean and dry.  The Band-Aids or dressings may be removed the day after surgery.  Should the incision become sore, red, and swollen after the first week, check with your doctor.  Personal hygiene:  Shower the day after your procedure.  Activity and limitations:  Do NOT drive or operate any equipment today.  Do NOT lift anything more than 15 pounds for 2-3 weeks after surgery.  Do NOT rest in bed all day.  Walking is encouraged. Walk each day, starting slowly with 5-minute walks 3 or 4 times a day. Slowly increase the length of your walks.  Walk up and down stairs slowly.  Do NOT do strenuous activities, such as golfing, playing tennis, bowling, running, biking, weight lifting, gardening, mowing, or vacuuming for 2-4 weeks. Ask your doctor when it is okay to start.  Diet: Eat a light meal as desired this evening. You may resume your usual diet tomorrow.  Return to work: This is dependent on the type of work you do. For the most part you can return to a desk job within a week of surgery. If you are more active at work, please discuss this with your doctor.  What to expect after your surgery: You may have a slight burning sensation when you urinate on the first day. You may have a very small amount of blood in the urine. Expect to have a small amount of vaginal discharge/light bleeding for 1-2 weeks. It is not unusual to have abdominal soreness and bruising for up to 2 weeks. You may be tired and need more rest for  about 1 week. You may experience shoulder pain for 24-72 hours. Lying flat in bed may relieve it.  Call your doctor for any of the following:  Develop a fever of 100.4 or greater  Inability to urinate 6 hours after discharge from hospital  Severe pain not relieved by pain medications  Persistent of heavy bleeding at incision site  Redness or swelling around incision site after a week  Increasing nausea or vomiting     Post Anesthesia Home Care Instructions  Activity: Get plenty of rest for the remainder of the day. A responsible individual must stay with you for 24 hours following the procedure.  For the next 24 hours, DO NOT: -Drive a car -Paediatric nurse -Drink alcoholic beverages -Take any medication unless instructed by your physician -Make any legal decisions or sign important papers.  Meals: Start with liquid foods such as gelatin or soup. Progress to regular foods as tolerated. Avoid greasy, spicy, heavy foods. If nausea and/or vomiting occur, drink only clear liquids until the nausea and/or vomiting subsides. Call your physician if vomiting continues.  Special Instructions/Symptoms: Your throat may feel dry or sore from the anesthesia or the breathing tube placed in your throat during surgery. If this causes discomfort, gargle with warm salt water. The discomfort should disappear within 24 hours.

## 2022-10-07 NOTE — Op Note (Signed)
   Amy Henry 10/07/2022   Pre-op Diagnosis: Symptomatic cholelithiasis     Post-op Diagnosis: same  Procedure(s):  LAPAROSCOPIC CHOLECYSTECTOMY  Surgeon: Coralie Keens, MD  Anesthesia: General  Staff:  Circulator: Carlstadt Bing, RN Relief Circulator: [place] Letta Pate, RN Scrub Person: Lynda Rainwater I Surgical/MD Assistant: Juliene Pina, CNM  Estimated Blood Loss: Minimal               Specimens: sent to path  Indications: This is a 42 year old female with symptomatic cholelithiasis.  She also has had gynecologic issues with her ovaries.  She will be undergoing a combined laparoscopic removal of her right tube and ovary by Dr. Benjie Karvonen and then will be undergoing a laparoscopic cholecystectomy  Findings: The patient was found to have a mildly chronically inflamed gallbladder with a large stone in the neck of his gallbladder  Procedure: I presented to the operating room after the completion of the gynecologic procedure.  She had the [place] port above the umbilicus still in place.  Her other lower 5 mm trocars have been removed.  I placed a 5 mm trocar in the patient's epigastrium and 2 more in the right upper quadrant both under direct vision.  The gallbladder was easily visualized and found to be mildly distended and mildly thick-walled.  It was elevated above the liver.  I dissected out the base of the gallbladder.  The cystic duct and cystic artery were dissected out and I achieved a critical window around both.  I clipped the cystic duct 3 times proximally 1 distally and transected it.  I then clipped the cystic artery twice proximally once distally and transected as well.  I then slowly dissected the gallbladder free from her bed with electrocautery.  Once the gallbladder was freed from the liver bed I placed it in an Endosac and removed it through the incision at the umbilicus.  I then copiously irrigated the abdomen with saline.  Hemostasis  appeared to be achieved.  All ports were then removed under direct vision and the abdomen was deflated.  I closed the fascia at the umbilical incision with a figure-of-eight 0 Vicryl suture.  I anesthetized the incisions with Marcaine and closed all incisions with 4-0 Monocryl subcuticular sutures.  Dermabond was then applied.  The patient tolerated the procedure well.  All the counts were correct at the end of the procedure.  The patient was then extubated in the operating room and taken in a stable condition to the recovery room.          Coralie Keens   Date: 10/07/2022  Time: 9:26 AM

## 2022-10-07 NOTE — Op Note (Signed)
10/07/2022 Amy Henry  [birth year]  Preoperative diagnosis: Right hydrosalpinx  Postoperative diagnosis: Right hydrosalpinx, omental adhesions. Left fallopian tube patent  Procedure: Laparoscopic omental adhesiolysis and right salpingectomy and chromopertubation  (See Dr Trevor Mace Op note for cholecystectomy)   Surgeon: Azucena Fallen, MD Assistants:  Derrell Lolling. CNM  Anesthesia Gen. Endotracheal IV fluids LR  1000 cc LR EBL minimal  20 cc Urine clear  100 cc clear in foley, foley to be removed before extubation  Complications none Disposition PACU and home Specimens: right fallopian tube   Procedure Patient was diagnosed with right fallopian tube hydrosalpinx. She has patent left fallopian tube. She has history of bilateral ovarian cysts removal several years back.  After counseling the decision was made to remove right fallopian tube with hydrosalpinx. Informed written consent was obtained and patient was brought to the operating room with IV running. Timeout was carried out. 2 gm Ancef given.  She underwent general anesthesia without difficulty and was given dorsal lithotomy position. Foley placed and Zumi uterine manipulator inserted.  Gloves/ gown changed and now moved to abdomen. A 10 mm incision made at the umbilicus's upper edge after injecting 0.25% Marcaine. Fascia grasped and incised. Peritoneum entered bluntly with finger dissection. Hassan canula with balloon placed, balloon inflated. Pneumoperitoneum created.  Omental adhesions noted in midline right below the umbilicus involving mid half or lower peritoneal surface. Right lower quadrant incision made after marcaine injection, a 5 mm trocar inserted under vision. Using endo-scissors with intermittent cautery omental adhesions taken down after confirming now bowel present. Now a left lower quadrant 5 mm trocar inserted through 5 mm incision and inserted under vision. Left fallopian tube was free, left ovary was adherent to colon  fat and right fallopian tube had small hydrosalpinx and right ovary was normal. I performed chromopertubation to assess tubal patency before making decision for right salpingectomy. Left tube noted spill of blue dye immediately and right noted minimal spill with dye getting built up in the hydrosalpinx. So I proceed with right salpingectomy using Ligasure. Hemostasis noted. Right tube was retrieved from central port. Hemostasis noted again.  I removed uterine manipulator.  Patient was turned over to Dr Ninfa Linden for cholecystectomy/  Ports weill be closed by Dr Ninfa Linden and he will order post-op pain meds.  Appreciate working with Dr Ninfa Linden on this patient for a combined case.   Azucena Fallen, MD Erling Conte Har[facility] Battiest

## 2022-10-07 NOTE — Interval H&P Note (Signed)
History and Physical Interval Note: no change in H and P  10/07/2022 7:08 AM  Amy Henry  has presented today for surgery, with the diagnosis of Right Hydrosalpinx.  The various methods of treatment have been discussed with the patient and family. After consideration of risks, benefits and other options for treatment, the patient has consented to  Procedure(s) with comments: LAPAROSCOPIC LYSIS OF ADHESIONS with Laparoscopic Right Salpingectomy (Right) - Requests 90 min. LAPAROSCOPIC CHOLECYSTECTOMY (N/A) as a surgical intervention.  The patient's history has been reviewed, patient examined, no change in status, stable for surgery.  I have reviewed the patient's chart and labs.  Questions were answered to the patient's satisfaction.     Coralie Keens

## 2022-10-07 NOTE — Anesthesia Postprocedure Evaluation (Signed)
Anesthesia Post Note  Patient: Amy Henry  Procedure(s) Performed: LAPAROSCOPIC LYSIS OF ADHESIONS with Laparoscopic Right Salpingectomy (Right: Abdomen) LAPAROSCOPIC CHOLECYSTECTOMY (Abdomen)     Patient location during evaluation: PACU Anesthesia Type: General Level of consciousness: sedated and patient cooperative Pain management: pain level controlled Vital Signs Assessment: post-procedure vital signs reviewed and stable Respiratory status: spontaneous breathing Cardiovascular status: stable Anesthetic complications: no   No notable events documented.  Last Vitals:  Vitals:   10/07/22 1015 10/07/22 1030  BP: 129/78 127/77  Pulse: 84 82  Resp: (!) 22 19  Temp: (!) 36.4 C   SpO2: 97% 95%    Last Pain:  Vitals:   10/07/22 1015  TempSrc:   PainSc: [name]

## 2022-10-08 ENCOUNTER — Encounter (HOSPITAL_BASED_OUTPATIENT_CLINIC_OR_DEPARTMENT_OTHER): Payer: Self-pay | Admitting: Obstetrics & Gynecology

## 2022-10-08 LAB — SURGICAL PATHOLOGY

## 2022-10-22 ENCOUNTER — Encounter (HOSPITAL_COMMUNITY): Payer: Self-pay

## 2022-10-22 ENCOUNTER — Emergency Department (HOSPITAL_COMMUNITY)
Admission: EM | Admit: 2022-10-22 | Discharge: 2022-10-22 | Disposition: A | Discharge status: Home / Self Care | Payer: 59 | Attending: Emergency Medicine | Admitting: Emergency Medicine

## 2022-10-22 DIAGNOSIS — Z23 Encounter for immunization: Secondary | ICD-10-CM | POA: Insufficient documentation

## 2022-10-22 DIAGNOSIS — L03042 Acute lymphangitis of left toe: Secondary | ICD-10-CM | POA: Diagnosis not present

## 2022-10-22 DIAGNOSIS — J45909 Unspecified asthma, uncomplicated: Secondary | ICD-10-CM | POA: Insufficient documentation

## 2022-10-22 DIAGNOSIS — I891 Lymphangitis: Secondary | ICD-10-CM

## 2022-10-22 DIAGNOSIS — S61012A Laceration without foreign body of left thumb without damage to nail, initial encounter: Secondary | ICD-10-CM | POA: Diagnosis not present

## 2022-10-22 DIAGNOSIS — S65402A Unspecified injury of blood vessel of left thumb, initial encounter: Secondary | ICD-10-CM | POA: Diagnosis present

## 2022-10-22 DIAGNOSIS — W260XXA Contact with knife, initial encounter: Secondary | ICD-10-CM | POA: Diagnosis not present

## 2022-10-22 DIAGNOSIS — Z7982 Long term (current) use of aspirin: Secondary | ICD-10-CM | POA: Diagnosis not present

## 2022-10-22 LAB — BASIC METABOLIC PANEL
Anion gap: 6 (ref 5–15)
BUN: 11 mg/dL (ref 6–20)
CO2: 23 mmol/L (ref 22–32)
Calcium: 8.8 mg/dL — ABNORMAL LOW (ref 8.9–10.3)
Chloride: 104 mmol/L (ref 98–111)
Creatinine, Ser: 0.69 mg/dL (ref 0.44–1.00)
GFR, Estimated: >60 mL/min (ref >60)
Glucose, Bld: 115 mg/dL — ABNORMAL HIGH (ref 70–99)
Potassium: 3.7 mmol/L (ref 3.5–5.1)
Sodium: 133 mmol/L — ABNORMAL LOW (ref 135–145)

## 2022-10-22 LAB — CBC
HCT: 41.5 % (ref 36.0–46.0)
Hemoglobin: 13.2 g/dL (ref 12.0–15.0)
MCH: 27.4 pg (ref 26.0–34.0)
MCHC: 31.8 g/dL (ref 30.0–36.0)
MCV: 86.3 fL (ref 80.0–100.0)
Platelets: 285 10*3/uL (ref 150–400)
RBC: 4.81 MIL/uL (ref 3.87–5.11)
RDW: 12.7 % (ref 11.5–15.5)
WBC: 7 10*3/uL (ref 4.0–10.5)
nRBC: 0 % (ref 0.0–0.2)

## 2022-10-22 MED ORDER — CEPHALEXIN 500 MG PO CAPS: 500.0000 mg | ORAL_CAPSULE | Freq: Four times a day (QID) | ORAL | 0 refills | Status: AC

## 2022-10-22 MED ORDER — TETANUS-DIPHTH-ACELL PERTUSSIS 5-2.5-18.5 LF-MCG/0.5 IM SUSY
0.5000 mL | PREFILLED_SYRINGE | Freq: Once | INTRAMUSCULAR | Status: AC
  Administered 2022-10-22: 0.5 mL via INTRAMUSCULAR
  Filled 2022-10-22: qty 0.5

## 2022-10-22 MED ORDER — CEPHALEXIN 500 MG PO CAPS
500.0000 mg | ORAL_CAPSULE | Freq: Once | ORAL | Status: AC
  Administered 2022-10-22: 500 mg via ORAL
  Filled 2022-10-22: qty 1

## 2022-10-22 NOTE — ED Provider Notes (Signed)
[facility] Provider Note   CSN: [id number] Arrival date & time: 10/22/22  [id number]     History  Chief Complaint  Patient presents with   Laceration   Rash    Amy Henry is a 42 y.o. female with history of PCOS, asthma, GERD, anemia who presents emergency department complaining of laceration to the left thumb, and redness streaking up the arm.  Patient states that she cut herself with a kitchen knife today while cooking.  She later noticed some redness at the base of her left hand, and then noticed that it was tracking up her arm.  Reporting some discomfort in the axilla with swollen lymph nodes.  States that she had laparoscopic surgery 2 weeks ago including removal of her gallbladder and her right fallopian tube.  She had a postop appointment this morning, and everything went well.   Laceration Rash      Home Medications Prior to Admission medications   Medication Sig Start Date End Date Taking? Authorizing Provider  cephALEXin (KEFLEX) 500 MG capsule Take 1 capsule (500 mg total) by mouth 4 (four) times daily. 10/22/22  Yes Breken Nazari T, PA-C  albuterol (VENTOLIN HFA) 108 (90 Base) MCG/ACT inhaler Inhale 1-2 puffs into the lungs every 6 (six) hours as needed for wheezing or shortness of breath.    [provider]  aspirin EC 325 MG tablet Take 325 mg by mouth daily.    [provider]  Cholecalciferol (VITAMIN D3) 1.25 MG (50000 UT) capsule Take 50,000 Units by mouth once a week. Monday's    [provider]  famotidine (PEPCID) 20 MG tablet Take 1 tablet (20 mg total) by mouth 2 (two) times daily. Patient taking differently: Take 20 mg by mouth 2 (two) times daily as needed for heartburn. 07/01/21   Carlisle Cater, PA-C  metFORMIN (GLUCOPHAGE) 1000 MG tablet Take 1,000 mg by mouth 2 (two) times daily with a meal.    [provider]  ondansetron (ZOFRAN) 4 MG tablet Take 1 tablet (4 mg total)  by mouth every 8 (eight) hours as needed for nausea. 10/07/22   Coralie Keens, MD  Prenatal Vit-Fe Fumarate-FA (MULTIVITAMIN-PRENATAL) 27-0.8 MG TABS tablet Take 1 tablet by mouth daily at 12 noon.    [provider]  sucralfate (CARAFATE) 1 GM/10ML suspension Take 1 g by mouth as needed.    [provider]  traMADol (ULTRAM) 50 MG tablet Take 1 tablet (50 mg total) by mouth every 6 (six) hours as needed. 10/07/22   Coralie Keens, MD      Allergies    Patient has no known allergies.    Review of Systems   Review of Systems  Skin:  Positive for color change and wound.  All other systems reviewed and are negative.   Physical Exam Updated Vital Signs BP (!) 142/110 (BP Location: Left Arm)   Pulse 86   Temp 98.8 F (37.1 C) (Oral)   Resp 18   LMP 09/23/2022 (Exact Date)   SpO2 96%  Physical Exam Vitals and nursing note reviewed.  Constitutional:      Appearance: Normal appearance.  HENT:     Head: Normocephalic and atraumatic.  Eyes:     Conjunctiva/sclera: Conjunctivae normal.  Cardiovascular:     Rate and Rhythm: Normal rate and regular rhythm.  Pulmonary:     Effort: Pulmonary effort is normal. No respiratory distress.     Breath sounds: Normal breath sounds.  Abdominal:  General: There is no distension.     Palpations: Abdomen is soft.     Tenderness: There is no abdominal tenderness.  Lymphadenopathy:     Upper Body:     Left upper body: Axillary adenopathy present.  Skin:    General: Skin is warm and dry.     Comments: 0.5 cm laceration noted to the pad of the L thumb  Redness streaking up the arm from the base of the L thumb to the L axilla on the ventral surface, nontender  Bruising noted just lateral to the L antecubital fossa at site of prior IV insertion, no redness or tenderness  Neurological:     General: No focal deficit present.     Mental Status: She is alert.     ED Results / Procedures / Treatments   Labs (all labs  ordered are listed, but only abnormal results are displayed) Labs Reviewed  BASIC METABOLIC PANEL - Abnormal; Notable for the following components:      Result Value   Sodium 133 (*)    Glucose, Bld 115 (*)    Calcium 8.8 (*)    All other components within normal limits  CBC    EKG None  Radiology No results found.  Procedures Procedures    Medications Ordered in ED Medications  Tdap (BOOSTRIX) injection 0.5 mL (has no administration in time range)  cephALEXin (KEFLEX) capsule 500 mg (has no administration in time range)    ED Course/ Medical Decision Making/ A&P                             Medical Decision Making Amount and/or Complexity of Data Reviewed Labs: ordered.   This patient is a 42 y.o. female  who presents to the ED for concern of laceration to L thumb and redness streaking up the arm.   Differential diagnoses prior to evaluation: The emergent differential diagnosis includes, but is not limited to,  lymphangitis, phlebitis . This is not an exhaustive differential.   Past Medical History / Co-morbidities: PCOS, asthma, GERD, anemia  Additional history: Chart reviewed. Pertinent results include: Patient underwent laparoscopic lysis of adhesions, right salpingectomy, and cholecystectomy on 2/29 by Dr's Benjie Karvonen and Ninfa Linden  Physical Exam: Physical exam performed. The pertinent findings include: Mildly hypertensive, notes normal vital signs, no acute distress.  Small laceration noted to the pad of the left thumb, and subsequent redness streaking up the ventral surface of the arm to the left axilla.  With left axillary lymphadenopathy.  Lab Tests/Imaging studies: I personally interpreted labs/imaging and the pertinent results include: No leukocytosis, normal hemoglobin.  BMP grossly unremarkable.  Medications: I ordered medication including tdap and initial dose of keflex.  I have reviewed the patients home medicines and have made adjustments as needed.    Disposition: After consideration of the diagnostic results and the patients response to treatment, I feel that emergency department workup does not suggest an emergent condition requiring admission or immediate intervention beyond what has been performed at this time. The plan is: discharge to home with antibiotics for lymphangitis. Follows pattern from thumb laceration, however this would be an odd timeline for such streaking to show. Will cover for strep/staph with cephalexin and encourage PCP follow up. The patient is safe for discharge and has been instructed to return immediately for worsening symptoms, change in symptoms or any other concerns.  I discussed this case with my attending physician Dr. Christy Gentles who cosigned  this note including patient's presenting symptoms, physical exam, and planned diagnostics and interventions. Attending physician stated agreement with plan or made changes to plan which were implemented.   Final Clinical Impression(s) / ED Diagnoses Final diagnoses:  Laceration of left thumb without foreign body without damage to nail, initial encounter  Lymphangitis    Rx / DC Orders ED Discharge Orders          Ordered    cephALEXin (KEFLEX) 500 MG capsule  4 times daily        10/22/22 0342           Portions of this report may have been transcribed using voice recognition software. Every effort was made to ensure accuracy; however, inadvertent computerized transcription errors may be present.    Estill Cotta 10/22/22 0346    Ripley Fraise, MD 10/22/22 [phone]

## 2022-10-22 NOTE — Discharge Instructions (Addendum)
You were seen in the ER for a laceration and redness of your arm.  The pattern of the redness follows what we call lymphangitis, which is the pattern of your lymphatic system. We have updated your tetanus vaccination and started the course of antibiotics.  Continue to monitor how you're doing and return to the ER for new or worsening symptoms.

## 2022-10-22 NOTE — ED Triage Notes (Signed)
Pt states that she cut herself with a kitchen knife today on L thumb while cooking, no bleeding, last tetanus unknown.

## 2022-12-14 ENCOUNTER — Other Ambulatory Visit: Payer: Self-pay | Admitting: Family Medicine

## 2022-12-14 DIAGNOSIS — Z1231 Encounter for screening mammogram for malignant neoplasm of breast: Secondary | ICD-10-CM

## 2023-04-27 ENCOUNTER — Other Ambulatory Visit: Payer: Self-pay

## 2023-04-27 ENCOUNTER — Emergency Department (HOSPITAL_COMMUNITY)
Admission: EM | Admit: 2023-04-27 | Discharge: 2023-04-28 | Discharge status: Against Medical Advice | Payer: 59 | Attending: Emergency Medicine | Admitting: Emergency Medicine

## 2023-04-27 ENCOUNTER — Encounter (HOSPITAL_COMMUNITY): Payer: Self-pay | Admitting: Emergency Medicine

## 2023-04-27 DIAGNOSIS — R109 Unspecified abdominal pain: Secondary | ICD-10-CM | POA: Insufficient documentation

## 2023-04-27 DIAGNOSIS — Z5321 Procedure and treatment not carried out due to patient leaving prior to being seen by health care provider: Secondary | ICD-10-CM | POA: Insufficient documentation

## 2023-04-27 DIAGNOSIS — R3915 Urgency of urination: Secondary | ICD-10-CM | POA: Insufficient documentation

## 2023-04-27 LAB — URINALYSIS, ROUTINE W REFLEX MICROSCOPIC
Bacteria, UA: NONE SEEN
Bilirubin Urine: NEGATIVE
Glucose, UA: NEGATIVE mg/dL
Hgb urine dipstick: NEGATIVE
Ketones, ur: 5 mg/dL — AB
Nitrite: NEGATIVE
Protein, ur: NEGATIVE mg/dL
Specific Gravity, Urine: 1.023 (ref 1.005–1.030)
WBC, UA: >50 WBC/hpf (ref 0–5)
pH: 7 (ref 5.0–8.0)

## 2023-04-27 LAB — CBC
HCT: 41.2 % (ref 36.0–46.0)
Hemoglobin: 13.1 g/dL (ref 12.0–15.0)
MCH: 27.6 pg (ref 26.0–34.0)
MCHC: 31.8 g/dL (ref 30.0–36.0)
MCV: 86.9 fL (ref 80.0–100.0)
Platelets: 291 10*3/uL (ref 150–400)
RBC: 4.74 MIL/uL (ref 3.87–5.11)
RDW: 12.8 % (ref 11.5–15.5)
WBC: 7.8 10*3/uL (ref 4.0–10.5)
nRBC: 0 % (ref 0.0–0.2)

## 2023-04-27 LAB — COMPREHENSIVE METABOLIC PANEL WITH GFR
ALT: 74 U/L — ABNORMAL HIGH (ref 0–44)
AST: 59 U/L — ABNORMAL HIGH (ref 15–41)
Albumin: 4 g/dL (ref 3.5–5.0)
Alkaline Phosphatase: 135 U/L — ABNORMAL HIGH (ref 38–126)
Anion gap: 10 (ref 5–15)
BUN: 8 mg/dL (ref 6–20)
CO2: 26 mmol/L (ref 22–32)
Calcium: 9 mg/dL (ref 8.9–10.3)
Chloride: 103 mmol/L (ref 98–111)
Creatinine, Ser: 0.86 mg/dL (ref 0.44–1.00)
GFR, Estimated: >60 mL/min (ref >60)
Glucose, Bld: 105 mg/dL — ABNORMAL HIGH (ref 70–99)
Potassium: 3.5 mmol/L (ref 3.5–5.1)
Sodium: 139 mmol/L (ref 135–145)
Total Bilirubin: 0.5 mg/dL (ref 0.3–1.2)
Total Protein: 7.7 g/dL (ref 6.5–8.1)

## 2023-04-27 LAB — LIPASE, BLOOD: Lipase: 42 U/L (ref 11–51)

## 2023-04-27 LAB — HCG, SERUM, QUALITATIVE: Preg, Serum: NEGATIVE

## 2023-04-27 NOTE — ED Provider Triage Note (Signed)
Emergency Medicine Provider Triage Evaluation Note  Amy Henry , a 42 y.o. female  was evaluated in triage.  Pt complains of urinary urgency, dark urine, left flank and left sided abdominal pain x 3 days. Been gradually getting worse. No hx of kidney stones  Review of Systems  Positive: As above Negative: Fever, chills, N/V/D, vaginal discharge or bleeding  Physical Exam  BP (!) 130/91 (BP Location: Left Arm)   Pulse 88   Temp 97.9 F (36.6 C) (Oral)   Resp 18   Ht 5\' 4"  (1.626 m)   Wt 113.9 kg   LMP 04/11/2023   SpO2 99%   BMI 43.08 kg/m  Gen:   Awake, no distress   Resp:  Normal effort  MSK:   Moves extremities without difficulty  Other:    Medical Decision Making  Medically screening exam initiated at 10:30 PM.  Appropriate orders placed.  Amy Henry was informed that the remainder of the evaluation will be completed by another provider, this initial triage assessment does not replace that evaluation, and the importance of remaining in the ED until their evaluation is complete.  Workup initiated   Jaeden [place]bay T, PA-C 04/27/23 2231

## 2023-04-27 NOTE — ED Triage Notes (Signed)
Pt c/p left lower abdominal pain with urinary frequency that has been ongoing for the past 3 days.

## 2023-04-28 NOTE — ED Notes (Signed)
No answer for repeat VS

## 2023-04-28 NOTE — ED Notes (Signed)
No answer for VS
